# Patient Record
Sex: Female | Born: 1990 | Race: Black or African American | Hispanic: No | Marital: Single | Smoking: Current every day smoker
Health system: Southern US, Community
[De-identification: ages and names within clinical notes are randomized; demographics above are authoritative.]

## PROBLEM LIST (undated history)

## (undated) DIAGNOSIS — M549 Dorsalgia, unspecified: Secondary | ICD-10-CM

---

## 2009-03-27 ENCOUNTER — Emergency Department (HOSPITAL_BASED_OUTPATIENT_CLINIC_OR_DEPARTMENT_OTHER): Admission: EM | Admit: 2009-03-27 | Discharge: 2009-03-27 | Payer: Self-pay | Admitting: Emergency Medicine

## 2009-03-27 ENCOUNTER — Ambulatory Visit: Payer: Self-pay | Admitting: Diagnostic Radiology

## 2009-08-26 ENCOUNTER — Emergency Department (HOSPITAL_BASED_OUTPATIENT_CLINIC_OR_DEPARTMENT_OTHER): Admission: EM | Admit: 2009-08-26 | Discharge: 2009-08-26 | Payer: Self-pay | Admitting: Emergency Medicine

## 2009-11-30 ENCOUNTER — Emergency Department (HOSPITAL_BASED_OUTPATIENT_CLINIC_OR_DEPARTMENT_OTHER): Admission: EM | Admit: 2009-11-30 | Discharge: 2009-11-30 | Payer: Self-pay | Admitting: Emergency Medicine

## 2010-02-12 ENCOUNTER — Ambulatory Visit: Payer: Self-pay | Admitting: Diagnostic Radiology

## 2010-02-12 ENCOUNTER — Emergency Department (HOSPITAL_BASED_OUTPATIENT_CLINIC_OR_DEPARTMENT_OTHER): Admission: EM | Admit: 2010-02-12 | Discharge: 2010-02-12 | Payer: Self-pay | Admitting: Emergency Medicine

## 2010-06-09 ENCOUNTER — Emergency Department (HOSPITAL_BASED_OUTPATIENT_CLINIC_OR_DEPARTMENT_OTHER): Admission: EM | Admit: 2010-06-09 | Discharge: 2010-06-09 | Payer: Self-pay | Admitting: Emergency Medicine

## 2010-11-23 ENCOUNTER — Emergency Department (HOSPITAL_BASED_OUTPATIENT_CLINIC_OR_DEPARTMENT_OTHER)
Admission: EM | Admit: 2010-11-23 | Discharge: 2010-11-23 | Disposition: A | Discharge status: Home / Self Care | Payer: Medicaid Other | Attending: Emergency Medicine | Admitting: Emergency Medicine

## 2010-11-23 DIAGNOSIS — F17200 Nicotine dependence, unspecified, uncomplicated: Secondary | ICD-10-CM | POA: Insufficient documentation

## 2010-11-23 DIAGNOSIS — F319 Bipolar disorder, unspecified: Secondary | ICD-10-CM | POA: Insufficient documentation

## 2010-11-23 DIAGNOSIS — W57XXXA Bitten or stung by nonvenomous insect and other nonvenomous arthropods, initial encounter: Secondary | ICD-10-CM | POA: Insufficient documentation

## 2010-11-23 DIAGNOSIS — S90569A Insect bite (nonvenomous), unspecified ankle, initial encounter: Secondary | ICD-10-CM | POA: Insufficient documentation

## 2010-11-26 LAB — URINALYSIS, ROUTINE W REFLEX MICROSCOPIC
Glucose, UA: NEGATIVE mg/dL
Ketones, ur: 15 mg/dL — AB
Leukocytes, UA: NEGATIVE
Nitrite: NEGATIVE
Protein, ur: 30 mg/dL — AB
Specific Gravity, Urine: 1.043 — ABNORMAL HIGH (ref 1.005–1.030)
Urobilinogen, UA: 0.2 mg/dL (ref 0.0–1.0)

## 2010-11-26 LAB — URINE MICROSCOPIC-ADD ON

## 2010-11-26 LAB — PREGNANCY, URINE: Preg Test, Ur: NEGATIVE

## 2010-12-07 LAB — DIFFERENTIAL
Basophils Relative: 0 % (ref 0–1)
Eosinophils Absolute: 0 10*3/uL (ref 0.0–0.7)
Eosinophils Relative: 1 % (ref 0–5)
Lymphs Abs: 3 10*3/uL (ref 0.7–4.0)
Neutro Abs: 4.6 10*3/uL (ref 1.7–7.7)

## 2010-12-07 LAB — CBC
Hemoglobin: 12.2 g/dL (ref 12.0–15.0)
MCHC: 32.3 g/dL (ref 30.0–36.0)
MCV: 85.5 fL (ref 78.0–100.0)
WBC: 8.2 10*3/uL (ref 4.0–10.5)

## 2011-05-11 ENCOUNTER — Emergency Department (HOSPITAL_BASED_OUTPATIENT_CLINIC_OR_DEPARTMENT_OTHER)
Admission: EM | Admit: 2011-05-11 | Discharge: 2011-05-12 | Disposition: A | Discharge status: Home / Self Care | Payer: Self-pay | Attending: Emergency Medicine | Admitting: Emergency Medicine

## 2011-05-11 ENCOUNTER — Encounter: Payer: Self-pay | Admitting: *Deleted

## 2011-05-11 DIAGNOSIS — M549 Dorsalgia, unspecified: Secondary | ICD-10-CM

## 2011-05-11 DIAGNOSIS — Y92410 Unspecified street and highway as the place of occurrence of the external cause: Secondary | ICD-10-CM | POA: Insufficient documentation

## 2011-05-11 DIAGNOSIS — S060XAA Concussion with loss of consciousness status unknown, initial encounter: Secondary | ICD-10-CM | POA: Insufficient documentation

## 2011-05-11 DIAGNOSIS — R51 Headache: Secondary | ICD-10-CM | POA: Insufficient documentation

## 2011-05-11 DIAGNOSIS — M7918 Myalgia, other site: Secondary | ICD-10-CM

## 2011-05-11 DIAGNOSIS — R55 Syncope and collapse: Secondary | ICD-10-CM

## 2011-05-11 DIAGNOSIS — M25519 Pain in unspecified shoulder: Secondary | ICD-10-CM | POA: Insufficient documentation

## 2011-05-11 DIAGNOSIS — S060X9A Concussion with loss of consciousness of unspecified duration, initial encounter: Secondary | ICD-10-CM

## 2011-05-11 DIAGNOSIS — IMO0001 Reserved for inherently not codable concepts without codable children: Secondary | ICD-10-CM | POA: Insufficient documentation
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
## 2011-05-12 ENCOUNTER — Emergency Department (INDEPENDENT_AMBULATORY_CARE_PROVIDER_SITE_OTHER): Payer: Self-pay

## 2011-05-12 DIAGNOSIS — M549 Dorsalgia, unspecified: Secondary | ICD-10-CM

## 2011-05-12 DIAGNOSIS — R51 Headache: Secondary | ICD-10-CM

## 2011-05-12 LAB — CBC
HCT: 35.5 % — ABNORMAL LOW (ref 36.0–46.0)
MCHC: 33.5 g/dL (ref 30.0–36.0)
MCV: 84.7 fL (ref 78.0–100.0)
Platelets: 209 10*3/uL (ref 150–400)
RBC: 4.19 MIL/uL (ref 3.87–5.11)

## 2011-05-12 LAB — BASIC METABOLIC PANEL
BUN: 12 mg/dL (ref 6–23)
Chloride: 106 mEq/L (ref 96–112)
GFR calc Af Amer: >60 mL/min (ref >60)
Glucose, Bld: 84 mg/dL (ref 70–99)
Potassium: 4.1 mEq/L (ref 3.5–5.1)
Sodium: 140 mEq/L (ref 135–145)

## 2011-05-12 LAB — DIFFERENTIAL
Band Neutrophils: 0 % (ref 0–10)
Basophils Absolute: 0 10*3/uL (ref 0.0–0.1)
Lymphocytes Relative: 68 % — ABNORMAL HIGH (ref 12–46)
Promyelocytes Absolute: 0 %

## 2011-05-12 MED ORDER — IBUPROFEN 600 MG PO TABS: 600.0000 mg | ORAL_TABLET | Freq: Four times a day (QID) | ORAL | Status: AC | PRN

## 2011-05-12 MED ORDER — CYCLOBENZAPRINE HCL 10 MG PO TABS: 10.0000 mg | ORAL_TABLET | Freq: Two times a day (BID) | ORAL | Status: AC | PRN

## 2011-05-12 MED ORDER — ACETAMINOPHEN 500 MG PO TABS
1000.0000 mg | ORAL_TABLET | Freq: Once | ORAL | Status: AC
  Administered 2011-05-12: 1000 mg via ORAL
  Filled 2011-05-12: qty 2

## 2011-05-12 MED ORDER — HYDROCODONE-ACETAMINOPHEN 5-325 MG PO TABS: 2.0000 | ORAL_TABLET | ORAL | Status: AC | PRN
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
## 2011-06-28 ENCOUNTER — Emergency Department (INDEPENDENT_AMBULATORY_CARE_PROVIDER_SITE_OTHER): Payer: No Typology Code available for payment source

## 2011-06-28 ENCOUNTER — Encounter (HOSPITAL_BASED_OUTPATIENT_CLINIC_OR_DEPARTMENT_OTHER): Payer: Self-pay | Admitting: Family Medicine

## 2011-06-28 ENCOUNTER — Emergency Department (HOSPITAL_BASED_OUTPATIENT_CLINIC_OR_DEPARTMENT_OTHER)
Admission: EM | Admit: 2011-06-28 | Discharge: 2011-06-28 | Disposition: A | Discharge status: Home / Self Care | Payer: No Typology Code available for payment source | Attending: Emergency Medicine | Admitting: Emergency Medicine

## 2011-06-28 DIAGNOSIS — F17200 Nicotine dependence, unspecified, uncomplicated: Secondary | ICD-10-CM | POA: Insufficient documentation

## 2011-06-28 DIAGNOSIS — M549 Dorsalgia, unspecified: Secondary | ICD-10-CM

## 2011-06-28 DIAGNOSIS — Y92410 Unspecified street and highway as the place of occurrence of the external cause: Secondary | ICD-10-CM | POA: Insufficient documentation

## 2011-06-28 HISTORY — DX: Dorsalgia, unspecified: M54.9

## 2011-06-28 MED ORDER — IBUPROFEN 800 MG PO TABS
800.0000 mg | ORAL_TABLET | Freq: Once | ORAL | Status: AC
  Administered 2011-06-28: 800 mg via ORAL
  Filled 2011-06-28: qty 1

## 2011-06-28 MED ORDER — IBUPROFEN 600 MG PO TABS: 600.0000 mg | ORAL_TABLET | Freq: Four times a day (QID) | ORAL | Status: AC | PRN
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
## 2011-11-17 ENCOUNTER — Encounter (HOSPITAL_BASED_OUTPATIENT_CLINIC_OR_DEPARTMENT_OTHER): Payer: Self-pay | Admitting: *Deleted

## 2011-11-17 ENCOUNTER — Emergency Department (HOSPITAL_BASED_OUTPATIENT_CLINIC_OR_DEPARTMENT_OTHER)
Admission: EM | Admit: 2011-11-17 | Discharge: 2011-11-17 | Disposition: A | Discharge status: Home Health | Payer: Self-pay | Attending: Emergency Medicine | Admitting: Emergency Medicine

## 2011-11-17 DIAGNOSIS — R51 Headache: Secondary | ICD-10-CM | POA: Insufficient documentation

## 2011-11-17 DIAGNOSIS — K5289 Other specified noninfective gastroenteritis and colitis: Secondary | ICD-10-CM | POA: Insufficient documentation

## 2011-11-17 DIAGNOSIS — R1110 Vomiting, unspecified: Secondary | ICD-10-CM | POA: Insufficient documentation

## 2011-11-17 DIAGNOSIS — R197 Diarrhea, unspecified: Secondary | ICD-10-CM | POA: Insufficient documentation

## 2011-11-17 DIAGNOSIS — K529 Noninfective gastroenteritis and colitis, unspecified: Secondary | ICD-10-CM

## 2011-11-17 MED ORDER — ONDANSETRON HCL 4 MG PO TABS: 4.0000 mg | ORAL_TABLET | Freq: Four times a day (QID) | ORAL | Status: AC

## 2011-11-17 MED ORDER — ONDANSETRON 4 MG PO TBDP
4.0000 mg | ORAL_TABLET | Freq: Once | ORAL | Status: AC
  Administered 2011-11-17: 4 mg via ORAL
  Filled 2011-11-17: qty 1

## 2011-11-17 NOTE — ED Notes (Signed)
Chart reviewed and care assumed. 

## 2011-11-17 NOTE — ED Notes (Signed)
Pt c/o n/v/d x 1 day; 

## 2011-11-17 NOTE — Discharge Instructions (Signed)
Diet for Diarrhea, Adult Having frequent, runny stools (diarrhea) has many causes. Diarrhea may be caused or worsened by food or drink. Diarrhea may be relieved by changing your diet. IF YOU ARE NOT TOLERATING SOLID FOODS:  Drink enough water and fluids to keep your urine clear or pale yellow.   Avoid sugary drinks and sodas as well as milk-based beverages.   Avoid beverages containing caffeine and alcohol.   You may try rehydrating beverages. You can make your own by following this recipe:    tsp table salt.    tsp baking soda.   ? tsp salt substitute (potassium chloride).   1 tbs + 1 tsp sugar.   1 qt water.  As your stools become more solid, you can start eating solid foods. Add foods one at a time. If a certain food causes your diarrhea to get worse, avoid that food and try other foods. A low fiber, low-fat, and lactose-free diet is recommended. Small, frequent meals may be better tolerated.  Starches  Allowed:  White, French, and pita breads, plain rolls, buns, bagels. Plain muffins, matzo. Soda, saltine, or graham crackers. Pretzels, melba toast, zwieback. Cooked cereals made with water: cornmeal, farina, cream cereals. Dry cereals: refined corn, wheat, rice. Potatoes prepared any way without skins, refined macaroni, spaghetti, noodles, refined rice.   Avoid:  Bread, rolls, or crackers made with whole wheat, multi-grains, rye, bran seeds, nuts, or coconut. Corn tortillas or taco shells. Cereals containing whole grains, multi-grains, bran, coconut, nuts, or raisins. Cooked or dry oatmeal. Coarse wheat cereals, granola. Cereals advertised as "high-fiber." Potato skins. Whole grain pasta, wild or brown rice. Popcorn. Sweet potatoes/yams. Sweet rolls, doughnuts, waffles, pancakes, sweet breads.  Vegetables  Allowed: Strained tomato and vegetable juices. Most well-cooked and canned vegetables without seeds. Fresh: Tender lettuce, cucumber without the skin, cabbage, spinach, bean  sprouts.   Avoid: Fresh, cooked, or canned: Artichokes, baked beans, beet greens, broccoli, Brussels sprouts, corn, kale, legumes, peas, sweet potatoes. Cooked: Green or red cabbage, spinach. Avoid large servings of any vegetables, because vegetables shrink when cooked, and they contain more fiber per serving than fresh vegetables.  Fruit  Allowed: All fruit juices except prune juice. Cooked or canned: Apricots, applesauce, cantaloupe, cherries, fruit cocktail, grapefruit, grapes, kiwi, mandarin oranges, peaches, pears, plums, watermelon. Fresh: Apples without skin, ripe banana, grapes, cantaloupe, cherries, grapefruit, peaches, oranges, plums. Keep servings limited to  cup or 1 piece.   Avoid: Fresh: Apple with skin, apricots, mango, pears, raspberries, strawberries. Prune juice, stewed or dried prunes. Dried fruits, raisins, dates. Large servings of all fresh fruits.  Meat and Meat Substitutes  Allowed: Ground or well-cooked tender beef, ham, veal, lamb, pork, or poultry. Eggs, plain cheese. Fish, oysters, shrimp, lobster, other seafoods. Liver, organ meats.   Avoid: Tough, fibrous meats with gristle. Peanut butter, smooth or chunky. Cheese, nuts, seeds, legumes, dried peas, beans, lentils.  Milk  Allowed: Yogurt, lactose-free milk, kefir, drinkable yogurt, buttermilk, soy milk.   Avoid: Milk, chocolate milk, beverages made with milk, such as milk shakes.  Soups  Allowed: Bouillon, broth, or soups made from allowed foods. Any strained soup.   Avoid: Soups made from vegetables that are not allowed, cream or milk-based soups.  Desserts and Sweets  Allowed: Sugar-free gelatin, sugar-free frozen ice pops made without sugar alcohol.   Avoid: Plain cakes and cookies, pie made with allowed fruit, pudding, custard, cream pie. Gelatin, fruit, ice, sherbet, frozen ice pops. Ice cream, ice milk without nuts. Plain hard candy,   honey, jelly, molasses, syrup, sugar, chocolate syrup, gumdrops,  marshmallows.  Fats and Oils  Allowed: Avoid any fats and oils.   Avoid: Seeds, nuts, olives, avocados. Margarine, butter, cream, mayonnaise, salad oils, plain salad dressings made from allowed foods. Plain gravy, crisp bacon without rind.  Beverages  Allowed: Water, decaffeinated teas, oral rehydration solutions, sugar-free beverages.   Avoid: Fruit juices, caffeinated beverages (coffee, tea, soda or pop), alcohol, sports drinks, or lemon-lime soda or pop.  Condiments  Allowed: Ketchup, mustard, horseradish, vinegar, cream sauce, cheese sauce, cocoa powder. Spices in moderation: allspice, basil, bay leaves, celery powder or leaves, cinnamon, cumin powder, curry powder, ginger, mace, marjoram, onion or garlic powder, oregano, paprika, parsley flakes, ground pepper, rosemary, sage, savory, tarragon, thyme, turmeric.   Avoid: Coconut, honey.  Weight Monitoring: Weigh yourself every day. You should weigh yourself in the morning after you urinate and before you eat breakfast. Wear the same amount of clothing when you weigh yourself. Record your weight daily. Bring your recorded weights to your clinic visits. Tell your caregiver right away if you have gained 3 lb/1.4 kg or more in 1 day, 5 lb/2.3 kg in a week, or whatever amount you were told to report. SEEK IMMEDIATE MEDICAL CARE IF:   You are unable to keep fluids down.   You start to throw up (vomit) or diarrhea keeps coming back (persistent).   Abdominal pain develops, increases, or can be felt in one place (localizes).   You have an oral temperature above 102 F (38.9 C), not controlled by medicine.   Diarrhea contains blood or mucus.   You develop excessive weakness, dizziness, fainting, or extreme thirst.  MAKE SURE YOU:   Understand these instructions.   Will watch your condition.   Will get help right away if you are not doing well or get worse.  Document Released: 11/20/2003 Document Revised: 08/19/2011 Document Reviewed:  03/13/2009 [employer] Patient Information 2012 [employer]. 

## 2011-11-17 NOTE — ED Provider Notes (Addendum)
History     CSN: [id number]  Arrival date & time 11/17/11  1351   First MD Initiated Contact with Patient 11/17/11 1419      Chief Complaint  Patient presents with  . Emesis  . Diarrhea  . Headache    (Consider location/radiation/quality/duration/timing/severity/associated sxs/prior treatment) Patient is a 21 y.o. female presenting with vomiting, diarrhea, and headaches. The history is provided by the patient.  Emesis  This is a new problem. The current episode started 6 to 12 hours ago. Episode frequency: 1 time. The problem has not changed since onset.The emesis has an appearance of stomach contents. There has been no fever. Associated symptoms include diarrhea and headaches. Pertinent negatives include no abdominal pain, no cough, no fever and no URI. Risk factors include ill contacts.  Diarrhea The primary symptoms include vomiting and diarrhea. Primary symptoms do not include fever or abdominal pain. The illness began today (1 episode). The onset was sudden. The problem has not changed since onset. Associated medical issues do not include inflammatory bowel disease, GERD, gallstones or liver disease.  Headache  Associated symptoms include vomiting. Pertinent negatives include no fever.    Past Medical History  Diagnosis Date  . Back pain     History reviewed. No pertinent past surgical history.  History reviewed. No pertinent family history.  History  Substance Use Topics  . Smoking status: Current Everyday Smoker -- 0.5 packs/day    Types: Cigarettes  . Smokeless tobacco: Not on file  . Alcohol Use: Yes    OB History    Grav Para Term Preterm Abortions TAB SAB Ect Mult Living                  Review of Systems  Constitutional: Negative for fever.  Respiratory: Negative for cough.   Gastrointestinal: Positive for vomiting and diarrhea. Negative for abdominal pain.  Neurological: Positive for headaches.  All other systems reviewed and are  negative.    Allergies  Review of patient's allergies indicates no known allergies.  Home Medications   Current Outpatient Rx  Name Route Sig Dispense Refill  . ESTRADIOL CYPIONATE 5 MG/ML IM OIL Intramuscular Inject 2 mg into the muscle every 28 (twenty-eight) days.        BP 116/69  Pulse 85  Temp 98 F (36.7 C)  Resp 16  Ht 5\' 3"  (1.6 m)  Wt 136 lb (61.689 kg)  BMI 24.09 kg/m2  SpO2 100%  Physical Exam  Nursing note and vitals reviewed. Constitutional: She is oriented to person, place, and time. She appears well-developed and well-nourished. No distress.  HENT:  Head: Normocephalic and atraumatic.  Mouth/Throat: Mucous membranes are dry.  Eyes: EOM are normal. Pupils are equal, round, and reactive to light.  Cardiovascular: Normal rate, regular rhythm, normal heart sounds and intact distal pulses.  Exam reveals no friction rub.   No murmur heard. Pulmonary/Chest: Effort normal and breath sounds normal. She has no wheezes. She has no rales.  Abdominal: Soft. Bowel sounds are normal. She exhibits no distension. There is no tenderness. There is no rebound and no guarding.  Musculoskeletal: Normal range of motion. She exhibits no tenderness.       No edema  Neurological: She is alert and oriented to person, place, and time. No cranial nerve deficit.  Skin: Skin is warm and dry. No rash noted.  Psychiatric: She has a normal mood and affect. Her behavior is normal.    ED Course  Procedures (including critical care time)  Labs Reviewed - No data to display No results found.   1. Gastroenteritis       MDM   Pt with symptoms most consistent with a viral process with vomitting/diarrhea.  Denies bad food exposure and recent travel out of the country. REcent sick contacts with same sx. No recent abx.  No hx concerning for GU pathology or kidney stones.  Pt is awake and alert on exam without peritoneal signs.  No abd pain. Pt given ODT zofran.  Will Po challenge  2:55  PM Feeling better will d/c home.         Gwyneth Sprout, MD 11/17/11 1433  Gwyneth Sprout, MD 11/17/11 1455

## 2012-02-02 ENCOUNTER — Encounter (HOSPITAL_BASED_OUTPATIENT_CLINIC_OR_DEPARTMENT_OTHER): Payer: Self-pay

## 2012-02-02 ENCOUNTER — Emergency Department (HOSPITAL_BASED_OUTPATIENT_CLINIC_OR_DEPARTMENT_OTHER)
Admission: EM | Admit: 2012-02-02 | Discharge: 2012-02-02 | Disposition: A | Discharge status: Home / Self Care | Payer: Self-pay | Attending: Emergency Medicine | Admitting: Emergency Medicine

## 2012-02-02 DIAGNOSIS — R112 Nausea with vomiting, unspecified: Secondary | ICD-10-CM | POA: Insufficient documentation

## 2012-02-02 LAB — URINALYSIS, ROUTINE W REFLEX MICROSCOPIC
Hgb urine dipstick: NEGATIVE
Nitrite: NEGATIVE
Protein, ur: NEGATIVE mg/dL
Specific Gravity, Urine: 1.02 (ref 1.005–1.030)
pH: 7.5 (ref 5.0–8.0)

## 2012-02-02 LAB — PREGNANCY, URINE: Preg Test, Ur: NEGATIVE

## 2012-02-02 NOTE — Discharge Instructions (Signed)
Return to the ED with any concerns including vomiting and not able to keep down liquids or your medications, abdominal pain especially if it localizes to the right lower abdomen, fever or chills, and decreased urine output, decreased level of alertness or lethargy, or any other alarming symptoms.  °

## 2012-02-02 NOTE — ED Notes (Signed)
C/o abd pain, vomiting last night-no vomiting today since taking zofran-states "i just need a note for work"

## 2012-02-02 NOTE — ED Provider Notes (Signed)
History     CSN: [id number]  Arrival date & time 02/02/12  1535   First MD Initiated Contact with Patient 02/02/12 1548      Chief Complaint  Patient presents with  . Abdominal Pain  . Emesis    (Consider location/radiation/quality/duration/timing/severity/associated sxs/prior treatment) HPI Pt presents with c/o nausea and vomiting which began earlier today.  She denies any abdominal pain.  No fever/chills, no diarrhea.  She took some zofran which she had from a prior illness and this has improved her nausea.  She has been able to keep down liquids without difficulty.  Has not tried to eat solid food.  States she has been around others with similar illness at her work, she also states her job requested her to come to the ED and to bring a work note.  There are no other associated systemic symptoms.  She takes depo for birth control, LMP unknown.  There are no alleviating or modifying factors.   Past Medical History  Diagnosis Date  . Back pain     History reviewed. No pertinent past surgical history.  No family history on file.  History  Substance Use Topics  . Smoking status: Current Everyday Smoker -- 0.5 packs/day    Types: Cigarettes  . Smokeless tobacco: Not on file  . Alcohol Use: Yes    OB History    Grav Para Term Preterm Abortions TAB SAB Ect Mult Living                  Review of Systems ROS reviewed and all otherwise negative except for mentioned in HPI  Allergies  Review of patient's allergies indicates no known allergies.  Home Medications   Current Outpatient Rx  Name Route Sig Dispense Refill  . ESTRADIOL CYPIONATE 5 MG/ML IM OIL Intramuscular Inject 2 mg into the muscle every 28 (twenty-eight) days.        BP 112/70  Pulse 75  Temp(Src) 98.6 F (37 C) (Oral)  Resp 16  Ht 5\' 3"  (1.6 m)  Wt 132 lb (59.875 kg)  BMI 23.38 kg/m2  SpO2 100% Vitals reviewed Physical Exam Physical Examination: General appearance - alert, well appearing, and in  no distress Mental status - alert, oriented to person, place, and time Eyes - pupils equal and reactive, no scleral icterus Mouth - mucous membranes moist, pharynx normal without lesions Chest - clear to auscultation, no wheezes, rales or rhonchi, symmetric air entry Heart - normal rate, regular rhythm, normal S1, S2, no murmurs, rubs, clicks or gallops Abdomen - soft, nontender, nabs, nondistended, no masses or organomegaly Extremities - peripheral pulses normal, no pedal edema, no clubbing or cyanosis Skin - normal coloration and turgor, no rashes, brisk cap refill  ED Course  Procedures (including critical care time)   Labs Reviewed  URINALYSIS, ROUTINE W REFLEX MICROSCOPIC  PREGNANCY, URINE   No results found.   1. Nausea and vomiting       MDM  Pt presenting with vomiting which has been controlled at home with zofran that she had from a prior prescription.  She is nontoxic and well hdyrated in appearance, benign abdominal exam.  Urinalysis reassuring and urine preg negative.  Pt discharged with strict return precautions.  She is agreeable with this plan.         Ethelda Chick, MD 02/02/12 [phone]

## 2012-02-02 NOTE — ED Notes (Signed)
Pt reports nausea and vomiting, took Zofran and feels better.  States she's here for a work note.

## 2012-08-02 ENCOUNTER — Ambulatory Visit: Payer: Self-pay

## 2013-03-28 ENCOUNTER — Emergency Department (HOSPITAL_BASED_OUTPATIENT_CLINIC_OR_DEPARTMENT_OTHER)
Admission: EM | Admit: 2013-03-28 | Discharge: 2013-03-29 | Disposition: A | Discharge status: Home / Self Care | Payer: Self-pay | Attending: Emergency Medicine | Admitting: Emergency Medicine

## 2013-03-28 ENCOUNTER — Emergency Department (HOSPITAL_BASED_OUTPATIENT_CLINIC_OR_DEPARTMENT_OTHER): Payer: Self-pay

## 2013-03-28 ENCOUNTER — Encounter (HOSPITAL_BASED_OUTPATIENT_CLINIC_OR_DEPARTMENT_OTHER): Payer: Self-pay

## 2013-03-28 DIAGNOSIS — S93601A Unspecified sprain of right foot, initial encounter: Secondary | ICD-10-CM

## 2013-03-28 DIAGNOSIS — S93609A Unspecified sprain of unspecified foot, initial encounter: Secondary | ICD-10-CM | POA: Insufficient documentation

## 2013-03-28 DIAGNOSIS — IMO0002 Reserved for concepts with insufficient information to code with codable children: Secondary | ICD-10-CM | POA: Insufficient documentation

## 2013-03-28 DIAGNOSIS — F17200 Nicotine dependence, unspecified, uncomplicated: Secondary | ICD-10-CM | POA: Insufficient documentation

## 2013-03-28 DIAGNOSIS — Y9389 Activity, other specified: Secondary | ICD-10-CM | POA: Insufficient documentation

## 2013-03-28 DIAGNOSIS — Y929 Unspecified place or not applicable: Secondary | ICD-10-CM | POA: Insufficient documentation
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
## 2013-03-29 MED ORDER — NAPROXEN 250 MG PO TABS
500.0000 mg | ORAL_TABLET | Freq: Once | ORAL | Status: AC
  Administered 2013-03-29: 500 mg via ORAL
  Filled 2013-03-29 (×2): qty 2

## 2013-03-29 MED ORDER — HYDROCODONE-ACETAMINOPHEN 5-325 MG PO TABS: 1.0000 | ORAL_TABLET | ORAL | Status: DC | PRN

## 2013-03-29 NOTE — ED Provider Notes (Signed)
   History    CSN: [id number] Arrival date & time April 13, 2013  2247  First MD Initiated Contact with Patient 03/29/13 0045     Chief Complaint  Patient presents with  . Foot Injury   (Consider location/radiation/quality/duration/timing/severity/associated sxs/prior Treatment) HPI This is a 22 year old female who struck the dorsum of her right foot on a dresser about 7 PM yesterday evening. She was playing at the time. She is not sure exactly how she did it. She is now complaining of moderate to severe pain in the lateral aspect of the right foot as well as the sole of the right foot. There is no associated deformity but there is ecchymosis. She states she is unable to bear weight. She denies other injury.  Past Medical History  Diagnosis Date  . Back pain    History reviewed. No pertinent past surgical history. No family history on file. History  Substance Use Topics  . Smoking status: Current Every Day Smoker -- 0.50 packs/day    Types: Cigarettes  . Smokeless tobacco: Not on file  . Alcohol Use: Yes   OB History   Grav Para Term Preterm Abortions TAB SAB Ect Mult Living                 Review of Systems  All other systems reviewed and are negative.    Allergies  Review of patient's allergies indicates no known allergies.  Home Medications   Current Outpatient Rx  Name  Route  Sig  Dispense  Refill  . estradiol cypionate (DEPO-ESTRADIOL) 5 MG/ML injection   Intramuscular   Inject 2 mg into the muscle every 28 (twenty-eight) days.            BP 109/59  Pulse 86  Temp(Src) 98.1 F (36.7 C) (Oral)  Resp 16  Ht 5\' 3"  (1.6 m)  Wt 127 lb (57.607 kg)  BMI 22.5 kg/m2  SpO2 100%  Physical Exam General: Well-developed, well-nourished female in no acute distress; appearance consistent with age of record HENT: normocephalic, atraumatic Eyes: pupils equal round and reactive to light; extraocular muscles intact Neck: supple Heart: regular rate and rhythm Lungs:  clear to auscultation bilaterally Abdomen: soft; nondistended; nontender; no masses or hepatosplenomegaly; bowel sounds present Extremities: No deformity; full range of motion; pulses normal; tenderness and ecchymosis, but no significant swelling, over the lateral right foot with pain on movement of the foot Neurologic: Awake, alert and oriented; motor function intact in all extremities and symmetric; no facial droop Skin: Warm and dry Psychiatric: Normal mood and affect    ED Course  Procedures (including critical care time)   MDM  Nursing notes and vitals signs, including pulse oximetry, reviewed.  Summary of this visit's results, reviewed by myself:   Imaging Studies: Dg Foot Complete Right  2013-04-13   *RADIOLOGY REPORT*  Clinical Data: Foot injury.  RIGHT FOOT COMPLETE - 3+ VIEW  Comparison: None.  Findings: Three views of the right foot were obtained.  There is normal alignment.  Soft tissues are within normal limits.  IMPRESSION: No acute bony abnormality to the right foot.   Original Report Authenticated By: Richarda Overlie, M.D.      Hanley Seamen, MD 03/29/13 [phone]

## 2013-03-29 NOTE — ED Notes (Signed)
Two pillows provided to elevate right foot.  Ice applied.  Waiting for edp eval.

## 2013-03-29 NOTE — ED Notes (Signed)
MD at bedside. 

## 2013-04-20 ENCOUNTER — Emergency Department (HOSPITAL_BASED_OUTPATIENT_CLINIC_OR_DEPARTMENT_OTHER)
Admission: EM | Admit: 2013-04-20 | Discharge: 2013-04-20 | Disposition: A | Discharge status: Home / Self Care | Payer: Self-pay | Attending: Emergency Medicine | Admitting: Emergency Medicine

## 2013-04-20 ENCOUNTER — Encounter (HOSPITAL_BASED_OUTPATIENT_CLINIC_OR_DEPARTMENT_OTHER): Payer: Self-pay | Admitting: Emergency Medicine

## 2013-04-20 DIAGNOSIS — H109 Unspecified conjunctivitis: Secondary | ICD-10-CM | POA: Insufficient documentation

## 2013-04-20 DIAGNOSIS — F17200 Nicotine dependence, unspecified, uncomplicated: Secondary | ICD-10-CM | POA: Insufficient documentation

## 2013-04-20 DIAGNOSIS — Z23 Encounter for immunization: Secondary | ICD-10-CM | POA: Insufficient documentation

## 2013-04-20 MED ORDER — FLUORESCEIN SODIUM 1 MG OP STRP
1.0000 | ORAL_STRIP | Freq: Once | OPHTHALMIC | Status: AC
  Administered 2013-04-20: 1 via OPHTHALMIC
  Filled 2013-04-20: qty 1

## 2013-04-20 MED ORDER — ERYTHROMYCIN 5 MG/GM OP OINT: TOPICAL_OINTMENT | OPHTHALMIC | Status: DC

## 2013-04-20 MED ORDER — TETANUS-DIPHTH-ACELL PERTUSSIS 5-2.5-18.5 LF-MCG/0.5 IM SUSP
0.5000 mL | Freq: Once | INTRAMUSCULAR | Status: AC
  Administered 2013-04-20: 0.5 mL via INTRAMUSCULAR
  Filled 2013-04-20: qty 0.5

## 2013-04-20 MED ORDER — TETRACAINE HCL 0.5 % OP SOLN
1.0000 [drp] | Freq: Once | OPHTHALMIC | Status: AC
  Administered 2013-04-20: 1 [drp] via OPHTHALMIC
  Filled 2013-04-20: qty 2

## 2013-04-20 NOTE — ED Provider Notes (Signed)
CSN: [id number]     Arrival date & time 04/20/13  1624 History     First MD Initiated Contact with Patient 04/20/13 1743     Chief Complaint  Patient presents with  . Eye Pain   (Consider location/radiation/quality/duration/timing/severity/associated sxs/prior Treatment) The history is provided by the patient. No language interpreter was used.  Alicia Odonnell is a 22 y/o F with PMHx of back pain presenting to the ED with left eye irritation that started 2 days ago. Patient reported that he feels like something is in her eye, she reported that she looked in the mirror today and saw eyelashes in her eye. Patient reported that there is no radiation. Stated that she has been using hot washcloths for pain relief, denied any other remedies for pain control. Patient reported that when she woke up this morning she had crusts in her I, stated that she had to pull her I apartment this morning. Reported that she has been tearing clear tears, has been rubbing her eye continuously throughout the day, reported that she noticed mild swelling to the left eye. Patient stated that one of her friends was recently diagnosed with conjunctivitis, stated that she was hanging out with her when she was diagnosed with the eye infection, but stated that the patient was orientating antibiotic treatment. Denied for the loss of vision, eye pressure, eye pain, headache, dizziness, nausea, vomiting, fever, chills, trauma, injury, visual distortions. PCP none   Past Medical History  Diagnosis Date  . Back pain    History reviewed. No pertinent past surgical history. No family history on file. History  Substance Use Topics  . Smoking status: Current Every Day Smoker -- 0.50 packs/day    Types: Cigarettes  . Smokeless tobacco: Not on file  . Alcohol Use: Yes     Comment: occ   OB History   Grav Para Term Preterm Abortions TAB SAB Ect Mult Living                 Review of Systems  Constitutional: Negative for fever  and chills.  HENT: Negative for neck pain and neck stiffness.   Eyes: Positive for discharge, redness and itching. Negative for visual disturbance.  Respiratory: Negative for cough, chest tightness and shortness of breath.   Cardiovascular: Negative for chest pain.  Neurological: Negative for dizziness, weakness, light-headedness and headaches.  All other systems reviewed and are negative.    Allergies  Review of patient's allergies indicates no known allergies.  Home Medications   Current Outpatient Rx  Name  Route  Sig  Dispense  Refill  . erythromycin ophthalmic ointment      Place a 1/2 inch ribbon of ointment into the lower eyelid of left eye QID x 7 days   3.5 g   0   . estradiol cypionate (DEPO-ESTRADIOL) 5 MG/ML injection   Intramuscular   Inject 2 mg into the muscle every 28 (twenty-eight) days.           Marland Kitchen HYDROcodone-acetaminophen (NORCO/VICODIN) 5-325 MG per tablet   Oral   Take 1 tablet by mouth every 4 (four) hours as needed for pain.   20 tablet   0    BP 122/77  Pulse 84  Temp(Src) 98.5 F (36.9 C) (Oral)  Resp 16  Ht 5\' 4"  (1.626 m)  Wt 125 lb (56.7 kg)  BMI 21.45 kg/m2  SpO2 100% Physical Exam  Nursing note and vitals reviewed. Constitutional: She is oriented to person, place, and time. She  appears well-developed and well-nourished. No distress.  HENT:  Head: Normocephalic and atraumatic.  Mouth/Throat: Oropharynx is clear and moist. No oropharyngeal exudate.  Negative pain upon palpation to face  Eyes: EOM are normal. Pupils are equal, round, and reactive to light. Left eye exhibits discharge. Left eye exhibits no chemosis, no exudate and no hordeolum. No foreign body present in the left eye. Left conjunctiva is injected. Left conjunctiva has no hemorrhage. Left eye exhibits normal extraocular motion and no nystagmus. Left pupil is round and reactive.  Slit lamp exam:      The left eye shows no corneal abrasion, no corneal flare, no corneal  ulcer, no foreign body, no hyphema, no hypopyon, no fluorescein uptake and no anterior chamber bulge.    Mild swelling noted to the left eye - localized to the lower and upper lids.  Mild thick yellowish, discharge noted to the medical canthus Patient continuously rubbing eye - pruritus Injection to sclera of the left eye  Negative fluorescein uptake. Negative Seidel's sign. Negative dendritic lesion.   Neck: Normal range of motion. Neck supple.  Cardiovascular: Normal rate, regular rhythm and normal heart sounds.  Exam reveals no friction rub.   No murmur heard. Pulmonary/Chest: Breath sounds normal. No respiratory distress. She has no wheezes. She has no rales.  Lymphadenopathy:    She has no cervical adenopathy.  Neurological: She is alert and oriented to person, place, and time. She exhibits normal muscle tone. Coordination normal.  Skin: Skin is warm and dry. No rash noted. She is not diaphoretic. No erythema.  Psychiatric: She has a normal mood and affect. Her behavior is normal. Thought content normal.    ED Course   Procedures (including critical care time)  Medications  tetracaine (PONTOCAINE) 0.5 % ophthalmic solution 1 drop (1 drop Left Eye Given 04/20/13 1830)  fluorescein ophthalmic strip 1 strip (1 strip Left Eye Given 04/20/13 1831)  TDaP (BOOSTRIX) injection 0.5 mL (0.5 mLs Intramuscular Given 04/20/13 2011)    Labs Reviewed - No data to display No results found. 1. Conjunctivitis     MDM  Patient presenting to the ED with left eye pain, with tearing, discharge and pruritis. Sclera injection noted to the left eye. Positive clear tears noted. Mild discharge, yellowish in nature noted to the left medial canthus. PERRLA. EOMs intact. Negative findings on the slit lamp. Negative Seidel's. Negative corneal abrasions. Negative dendritic lesions. Doubt herpetic keratitis. Doubt pre- and post-septal cellulitis. Suspicion to be viral vs. bacterial conjunctivitis. Patient  stable, afebrile. Discharged patient with antibiotics. Discussed with patient to rest and stay hydrated. Discussed with patient to keep all items that come in contact with her eye to be clean. Referred to ophthalmology. Discussed with patient to continue to monitor symptoms and if symptoms are to worsen or change to report back to the ED - strict return instructions given. Patient agreed to plan of care, understood, all questions answered,   Raymon Mutton, PA-C 04/21/13 [phone]

## 2013-04-20 NOTE — ED Notes (Addendum)
Pt reports two days ago it "felt like there was something in my eye" (left eye). Pt had "people blow in it" to try to remove what was in her eye.  Pt states she woke up this morning with her left eye red, swollen, "had puss in it," and light sensitivity.  Pt states on the way here her vision got blurry.  Pt in NAD, AAOx4.

## 2013-04-20 NOTE — ED Notes (Signed)
Left eye irritation, redness, and drainage x2 days.

## 2013-04-22 ENCOUNTER — Emergency Department (HOSPITAL_BASED_OUTPATIENT_CLINIC_OR_DEPARTMENT_OTHER)
Admission: EM | Admit: 2013-04-22 | Discharge: 2013-04-23 | Disposition: A | Discharge status: Home / Self Care | Payer: Self-pay | Attending: Emergency Medicine | Admitting: Emergency Medicine

## 2013-04-22 ENCOUNTER — Encounter (HOSPITAL_BASED_OUTPATIENT_CLINIC_OR_DEPARTMENT_OTHER): Payer: Self-pay | Admitting: Emergency Medicine

## 2013-04-22 DIAGNOSIS — H169 Unspecified keratitis: Secondary | ICD-10-CM | POA: Insufficient documentation

## 2013-04-22 DIAGNOSIS — H5789 Other specified disorders of eye and adnexa: Secondary | ICD-10-CM | POA: Insufficient documentation

## 2013-04-22 DIAGNOSIS — H538 Other visual disturbances: Secondary | ICD-10-CM | POA: Insufficient documentation

## 2013-04-22 DIAGNOSIS — F17200 Nicotine dependence, unspecified, uncomplicated: Secondary | ICD-10-CM | POA: Insufficient documentation

## 2013-04-22 DIAGNOSIS — B9789 Other viral agents as the cause of diseases classified elsewhere: Secondary | ICD-10-CM | POA: Insufficient documentation

## 2013-04-22 DIAGNOSIS — H5710 Ocular pain, unspecified eye: Secondary | ICD-10-CM | POA: Insufficient documentation

## 2013-04-22 DIAGNOSIS — H168 Other keratitis: Secondary | ICD-10-CM

## 2013-04-22 MED ORDER — FLUORESCEIN SODIUM 1 MG OP STRP
1.0000 | ORAL_STRIP | Freq: Once | OPHTHALMIC | Status: AC
  Administered 2013-04-23: 1 via OPHTHALMIC
  Filled 2013-04-22: qty 1

## 2013-04-22 MED ORDER — PROPARACAINE HCL 0.5 % OP SOLN
2.0000 [drp] | Freq: Once | OPHTHALMIC | Status: AC
  Administered 2013-04-23: 2 [drp] via OPHTHALMIC
  Filled 2013-04-22: qty 15

## 2013-04-22 NOTE — ED Provider Notes (Signed)
CSN: [id number]     Arrival date & time 04/22/13  2302 History    This chart was scribed for Jones Skene, MD by Quintella Reichert, ED scribe.  This patient was seen in room MH12/MH12 and the patient's care was started at 11:51 PM.    Chief Complaint  Patient presents with  . Eye Problem    The history is provided by the patient. No language interpreter was used.    HPI Comments: Alicia Odonnell is a 22 y.o. female who presents to the Emergency Department complaining of 4 days of moderate-to-severe left eye pain with associated redness, discharge and blurred vision.  Pt states that her pain is exacerbated to a severity of 8/10 on movement of the eye to the left or right.  Discharge is worse in the morning and she has been waking up with her eyes "glued shut."  She also reports that when she woke up today she had "red lumps on my eyelid."  Pt was seen in the ED 2 days ago and diagnosed with conjunctivitis and given erythromycin ointment, which she has been applying 4x/day without relief.  She denies fevers, chills, abdominal pain, vomiting, rash, or pain or swelling in legs.    Past Medical History  Diagnosis Date  . Back pain     History reviewed. No pertinent past surgical history.   No family history on file.   History  Substance Use Topics  . Smoking status: Current Every Day Smoker -- 0.50 packs/day    Types: Cigarettes  . Smokeless tobacco: Not on file  . Alcohol Use: Yes     Comment: occ    OB History   Grav Para Term Preterm Abortions TAB SAB Ect Mult Living                  Review of Systems At least 10pt or greater review of systems completed and are negative except where specified in the HPI.   Allergies  Review of patient's allergies indicates no known allergies.  Home Medications   Current Outpatient Rx  Name  Route  Sig  Dispense  Refill  . erythromycin ophthalmic ointment      Place a 1/2 inch ribbon of ointment into the lower eyelid of left  eye QID x 7 days   3.5 g   0   . estradiol cypionate (DEPO-ESTRADIOL) 5 MG/ML injection   Intramuscular   Inject 2 mg into the muscle every 28 (twenty-eight) days.           Marland Kitchen HYDROcodone-acetaminophen (NORCO/VICODIN) 5-325 MG per tablet   Oral   Take 1 tablet by mouth every 4 (four) hours as needed for pain.   20 tablet   0    BP 127/83  Pulse 90  Temp(Src) 98.4 F (36.9 C) (Oral)  Resp 16  Ht 5\' 4"  (1.626 m)  Wt 125 lb (56.7 kg)  BMI 21.45 kg/m2  SpO2 100%  Physical Exam  Nursing notes reviewed.  Electronic medical record reviewed. VITAL SIGNS:   Filed Vitals:   04/22/13 2309  BP: 127/83  Pulse: 90  Temp: 98.4 F (36.9 C)  TempSrc: Oral  Resp: 16  Height: 5\' 4"  (1.626 m)  Weight: 125 lb (56.7 kg)  SpO2: 100%   CONSTITUTIONAL: Awake, oriented, appears non-toxic HENT: Atraumatic, normocephalic, oral mucosa pink and moist, airway patent. Nares patent without drainage. External ears normal. EYES: Conjunctiva on right, left adjunctive is injected with no peri-limbic flush, EOMI, PERRLA. Anterior chambers are  clear, no hypopyon no hyphema. No photophobia. Proparacaine relieves eye pain completely. Punctate fluorescein uptake scattered across her cornea NECK: Trachea midline, non-tender, supple CARDIOVASCULAR: Normal heart rate, Normal rhythm, No murmurs, rubs, gallops PULMONARY/CHEST: Clear to auscultation, no rhonchi, wheezes, or rales. Symmetrical breath sounds. Non-tender. ABDOMINAL: Non-distended, soft, non-tender - no rebound or guarding.  BS normal. NEUROLOGIC: Non-focal, moving all four extremities, no gross sensory or motor deficits. EXTREMITIES: No clubbing, cyanosis, or edema SKIN: Warm, Dry, No erythema, No rash  ED Course  Procedures (including critical care time)  DIAGNOSTIC STUDIES: Oxygen Saturation is 100% on room air, normal by my interpretation.      Labs Reviewed - No data to display No results found. 1. Viral keratitis    Medications   ciprofloxacin (CILOXAN) 0.3 % ophthalmic solution 2 drop (2 drops Left Eye Given 04/23/13 0200)  proparacaine (ALCAINE) 0.5 % ophthalmic solution 2 drop (2 drops Left Eye Given 04/23/13 0010)  fluorescein ophthalmic strip 1 strip (1 strip Left Eye Given 04/23/13 0011)     MDM  Patient with likely viral keratitis. Patient is rubbing her eye frequently though, will switch her antibiotics to ciprofloxacin, also encourage lubricating eyedrops.  Likely self-limited infection, no dendritic forms on the eye, no fever, chills, no lymphadenopathy, patient's symptoms resolved on additional proparacaine suggestive of the superficial conjunctivitis.  I personally performed the services described in this documentation, which was scribed in my presence. The recorded information has been reviewed and is accurate. Jones Skene, M.D.      Jones Skene, MD 04/23/13 [phone]

## 2013-04-22 NOTE — ED Notes (Signed)
Left eye red.  Seen here 8/6 for same c/o.  Given ointment and states she has been using it but eye is no better.

## 2013-04-23 MED ORDER — FLUORESCEIN SODIUM 1 MG OP STRP
ORAL_STRIP | OPHTHALMIC | Status: AC
  Filled 2013-04-23: qty 1

## 2013-04-23 MED ORDER — CIPROFLOXACIN HCL 0.3 % OP SOLN: 2.0000 [drp] | OPHTHALMIC | Status: DC

## 2013-04-23 MED ORDER — CIPROFLOXACIN HCL 0.3 % OP SOLN
OPHTHALMIC | Status: AC
  Administered 2013-04-23: 2 [drp] via OPHTHALMIC
  Filled 2013-04-23: qty 2.5

## 2013-04-23 NOTE — ED Notes (Signed)
MD back at bedside to complete eye exam

## 2013-04-25 NOTE — ED Provider Notes (Signed)
Medical screening examination/treatment/procedure(s) were performed by non-physician practitioner and as supervising physician I was immediately available for consultation/collaboration.  Derwood Kaplan, MD 04/25/13 1215

## 2013-08-11 ENCOUNTER — Emergency Department (HOSPITAL_BASED_OUTPATIENT_CLINIC_OR_DEPARTMENT_OTHER)
Admission: EM | Admit: 2013-08-11 | Discharge: 2013-08-11 | Disposition: A | Discharge status: Home / Self Care | Payer: Self-pay | Attending: Emergency Medicine | Admitting: Emergency Medicine

## 2013-08-11 ENCOUNTER — Emergency Department (HOSPITAL_BASED_OUTPATIENT_CLINIC_OR_DEPARTMENT_OTHER): Payer: Self-pay

## 2013-08-11 ENCOUNTER — Encounter (HOSPITAL_BASED_OUTPATIENT_CLINIC_OR_DEPARTMENT_OTHER): Payer: Self-pay | Admitting: Emergency Medicine

## 2013-08-11 DIAGNOSIS — M79642 Pain in left hand: Secondary | ICD-10-CM

## 2013-08-11 DIAGNOSIS — Y929 Unspecified place or not applicable: Secondary | ICD-10-CM | POA: Insufficient documentation

## 2013-08-11 DIAGNOSIS — M25532 Pain in left wrist: Secondary | ICD-10-CM

## 2013-08-11 DIAGNOSIS — S6990XA Unspecified injury of unspecified wrist, hand and finger(s), initial encounter: Secondary | ICD-10-CM | POA: Insufficient documentation

## 2013-08-11 DIAGNOSIS — Y939 Activity, unspecified: Secondary | ICD-10-CM | POA: Insufficient documentation

## 2013-08-11 DIAGNOSIS — S59909A Unspecified injury of unspecified elbow, initial encounter: Secondary | ICD-10-CM | POA: Insufficient documentation

## 2013-08-11 DIAGNOSIS — Z8739 Personal history of other diseases of the musculoskeletal system and connective tissue: Secondary | ICD-10-CM | POA: Insufficient documentation

## 2013-08-11 DIAGNOSIS — Z79899 Other long term (current) drug therapy: Secondary | ICD-10-CM | POA: Insufficient documentation

## 2013-08-11 DIAGNOSIS — S79919A Unspecified injury of unspecified hip, initial encounter: Secondary | ICD-10-CM | POA: Insufficient documentation

## 2013-08-11 DIAGNOSIS — F17200 Nicotine dependence, unspecified, uncomplicated: Secondary | ICD-10-CM | POA: Insufficient documentation

## 2013-08-11 DIAGNOSIS — R296 Repeated falls: Secondary | ICD-10-CM | POA: Insufficient documentation

## 2013-08-11 DIAGNOSIS — Z792 Long term (current) use of antibiotics: Secondary | ICD-10-CM | POA: Insufficient documentation

## 2013-08-11 MED ORDER — IBUPROFEN 800 MG PO TABS
800.0000 mg | ORAL_TABLET | Freq: Once | ORAL | Status: AC
  Administered 2013-08-11: 800 mg via ORAL
  Filled 2013-08-11: qty 1

## 2013-08-11 MED ORDER — IBUPROFEN 600 MG PO TABS: 600.0000 mg | ORAL_TABLET | Freq: Four times a day (QID) | ORAL | Status: DC | PRN

## 2013-08-11 NOTE — ED Provider Notes (Signed)
CSN: [id number]     Arrival date & time 08/11/13  1708 History  This chart was scribed for Alicia Chick, MD by Dorothey Baseman, ED Scribe. This patient was seen in room MHT13/MHT13 and the patient's care was started at 7:42 PM.    Chief Complaint  Patient presents with  . Hand Pain   Patient is a 22 y.o. female presenting with hand pain. The history is provided by the patient. No language interpreter was used.  Hand Pain This is a new problem. The current episode started 12 to 24 hours ago. The problem occurs constantly. The problem has not changed since onset.Exacerbated by: movement. Nothing relieves the symptoms. She has tried nothing for the symptoms.   HPI Comments: Lylliana Kitamura is a 22 y.o. female who presents to the Emergency Department complaining of a constant pain to the left hand, left wrist, and left hip onset around 17 hours ago when she states that she fell onto the pavement and landed on the left hand and hip. She reports that the pain is exacerbated with movement. She denies taking any medications at home to manage her symptoms. Patient has no other pertinent medical history.  She has been able to bear weight without difficulty. She states her main concern is the hand and wrist pain.   Past Medical History  Diagnosis Date  . Back pain    History reviewed. No pertinent past surgical history. History reviewed. No pertinent family history. History  Substance Use Topics  . Smoking status: Current Every Day Smoker -- 0.50 packs/day    Types: Cigarettes  . Smokeless tobacco: Not on file  . Alcohol Use: Yes     Comment: occ   OB History   Grav Para Term Preterm Abortions TAB SAB Ect Mult Living                 Review of Systems  Musculoskeletal: Positive for arthralgias and myalgias.  All other systems reviewed and are negative.    Allergies  Review of patient's allergies indicates no known allergies.  Home Medications   Current Outpatient Rx  Name  Route   Sig  Dispense  Refill  . erythromycin ophthalmic ointment      Place a 1/2 inch ribbon of ointment into the lower eyelid of left eye QID x 7 days   3.5 g   0   . estradiol cypionate (DEPO-ESTRADIOL) 5 MG/ML injection   Intramuscular   Inject 2 mg into the muscle every 28 (twenty-eight) days.           Marland Kitchen HYDROcodone-acetaminophen (NORCO/VICODIN) 5-325 MG per tablet   Oral   Take 1 tablet by mouth every 4 (four) hours as needed for pain.   20 tablet   0   . ibuprofen (ADVIL,MOTRIN) 600 MG tablet   Oral   Take 1 tablet (600 mg total) by mouth every 6 (six) hours as needed.   30 tablet   0    BP 106/69  Pulse 88  Temp(Src) 98.3 F (36.8 C) (Oral)  Resp 16  SpO2 100%  Physical Exam  Nursing note and vitals reviewed. Constitutional: She is oriented to person, place, and time. She appears well-developed and well-nourished. No distress.  HENT:  Head: Normocephalic and atraumatic.  Eyes: Conjunctivae are normal.  Neck: Normal range of motion. Neck supple.  Pulmonary/Chest: Effort normal. No respiratory distress.  Abdominal: She exhibits no distension.  Musculoskeletal: Normal range of motion.  Tenderness to palpation on palmar surface  of the left hand. Tenderness to palpation to anatomic snuff box. Hand is distally and neurovascularly intact. MVI.   Neurological: She is alert and oriented to person, place, and time.  Skin: Skin is warm and dry.  Psychiatric: She has a normal mood and affect. Her behavior is normal.    ED Course  Procedures (including critical care time)  DIAGNOSTIC STUDIES:   COORDINATION OF CARE: 7:43 PM- Discussed that x-ray results were negative. Will order an x-ray of the left wrist. Will discharge patient with a wrist splint and pain medication. Advised patient to follow up with the referred hand specialist if symptoms do not improve. Discussed treatment plan with patient at bedside and patient verbalized agreement.   Labs Review Labs Reviewed -  No data to display  Imaging Review Dg Femur Left  08/11/2013   CLINICAL DATA:  Left leg pain following fall  EXAM: LEFT FEMUR - 2 VIEW  COMPARISON:  None.  FINDINGS: No evidence of acute fracture,subluxation or dislocation identified.  No radio-opaque foreign bodies are present.  No focal bony lesions are noted.  The joint spaces are unremarkable.  IMPRESSION: Negative.   Electronically Signed   By: Laveda Abbe M.D.   On: 08/11/2013 18:14   Dg Hand Complete Left  08/11/2013   CLINICAL DATA:  Left hand injury and pain  EXAM: LEFT HAND - COMPLETE 3+ VIEW  COMPARISON:  None  FINDINGS: No evidence of acute fracture,subluxation or dislocation identified.  No radio-opaque foreign bodies are present.  No focal bony lesions are noted.  The joint spaces are unremarkable.  IMPRESSION: Negative.   Electronically Signed   By: Laveda Abbe M.D.   On: 08/11/2013 18:13   Dg Wrist Complete Left  08/11/2013   CLINICAL DATA:  Fall  EXAM: LEFT WRIST - COMPLETE 3+ VIEW  COMPARISON:  None.  FINDINGS: No acute fracture.  No dislocation.  Unremarkable soft tissues.  IMPRESSION: No acute bony pathology.   Electronically Signed   By: Maryclare Bean M.D.   On: 08/11/2013 20:16   EKG Interpretation   None       MDM   1. Wrist pain, acute, left   2. Hand pain, left    Pt presents with c/o left hand pain and left leg pain after fall.  Pt does have tenderness to palpation in anatomic snuffbox- hand films ordered intiially by triage nurse- I added wrist films.  These were both reassuring.  Pt placed in wrist splint, given ibuprofen and information for hand followup.  Discharged with strict return precautions.  Pt agreeable with plan.  I personally performed the services described in this documentation, which was scribed in my presence. The recorded information has been reviewed and is accurate.     Alicia Chick, MD 08/12/13 2051
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
## 2013-09-13 ENCOUNTER — Emergency Department (HOSPITAL_BASED_OUTPATIENT_CLINIC_OR_DEPARTMENT_OTHER)
Admission: EM | Admit: 2013-09-13 | Discharge: 2013-09-13 | Disposition: A | Discharge status: Home / Self Care | Payer: Medicaid Other | Attending: Emergency Medicine | Admitting: Emergency Medicine

## 2013-09-13 ENCOUNTER — Encounter (HOSPITAL_BASED_OUTPATIENT_CLINIC_OR_DEPARTMENT_OTHER): Payer: Self-pay | Admitting: Emergency Medicine

## 2013-09-13 DIAGNOSIS — IMO0002 Reserved for concepts with insufficient information to code with codable children: Secondary | ICD-10-CM | POA: Insufficient documentation

## 2013-09-13 DIAGNOSIS — S0081XA Abrasion of other part of head, initial encounter: Secondary | ICD-10-CM

## 2013-09-13 DIAGNOSIS — Z23 Encounter for immunization: Secondary | ICD-10-CM | POA: Insufficient documentation

## 2013-09-13 DIAGNOSIS — F17200 Nicotine dependence, unspecified, uncomplicated: Secondary | ICD-10-CM | POA: Insufficient documentation

## 2013-09-13 DIAGNOSIS — W503XXA Accidental bite by another person, initial encounter: Secondary | ICD-10-CM

## 2013-09-13 LAB — RAPID HIV SCREEN (WH-MAU): Rapid HIV Screen: NONREACTIVE

## 2013-09-13 MED ORDER — AMOXICILLIN-POT CLAVULANATE 500-125 MG PO TABS: 1.0000 | ORAL_TABLET | Freq: Three times a day (TID) | ORAL | Status: DC

## 2013-09-13 MED ORDER — TETANUS-DIPHTH-ACELL PERTUSSIS 5-2.5-18.5 LF-MCG/0.5 IM SUSP
0.5000 mL | Freq: Once | INTRAMUSCULAR | Status: AC
  Administered 2013-09-13: 0.5 mL via INTRAMUSCULAR
  Filled 2013-09-13: qty 0.5
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
## 2014-05-03 ENCOUNTER — Encounter (HOSPITAL_COMMUNITY): Payer: Self-pay | Admitting: Emergency Medicine

## 2014-05-03 ENCOUNTER — Emergency Department (HOSPITAL_COMMUNITY)
Admission: EM | Admit: 2014-05-03 | Discharge: 2014-05-03 | Discharge status: Against Medical Advice | Payer: Self-pay | Attending: Emergency Medicine | Admitting: Emergency Medicine

## 2014-05-03 ENCOUNTER — Ambulatory Visit (HOSPITAL_COMMUNITY): Payer: Medicaid Other

## 2014-05-03 ENCOUNTER — Emergency Department (HOSPITAL_COMMUNITY): Payer: Self-pay

## 2014-05-03 DIAGNOSIS — S199XXA Unspecified injury of neck, initial encounter: Secondary | ICD-10-CM

## 2014-05-03 DIAGNOSIS — F17200 Nicotine dependence, unspecified, uncomplicated: Secondary | ICD-10-CM | POA: Insufficient documentation

## 2014-05-03 DIAGNOSIS — S0993XA Unspecified injury of face, initial encounter: Secondary | ICD-10-CM | POA: Insufficient documentation

## 2014-05-03 DIAGNOSIS — Y9389 Activity, other specified: Secondary | ICD-10-CM | POA: Insufficient documentation

## 2014-05-03 DIAGNOSIS — S139XXA Sprain of joints and ligaments of unspecified parts of neck, initial encounter: Secondary | ICD-10-CM | POA: Insufficient documentation

## 2014-05-03 DIAGNOSIS — IMO0002 Reserved for concepts with insufficient information to code with codable children: Secondary | ICD-10-CM | POA: Insufficient documentation

## 2014-05-03 DIAGNOSIS — Y92410 Unspecified street and highway as the place of occurrence of the external cause: Secondary | ICD-10-CM | POA: Insufficient documentation

## 2014-05-03 DIAGNOSIS — S161XXA Strain of muscle, fascia and tendon at neck level, initial encounter: Secondary | ICD-10-CM
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
## 2014-05-06 ENCOUNTER — Emergency Department (HOSPITAL_BASED_OUTPATIENT_CLINIC_OR_DEPARTMENT_OTHER): Payer: Self-pay

## 2014-05-06 ENCOUNTER — Emergency Department (HOSPITAL_BASED_OUTPATIENT_CLINIC_OR_DEPARTMENT_OTHER)
Admission: EM | Admit: 2014-05-06 | Discharge: 2014-05-06 | Disposition: A | Discharge status: Home / Self Care | Payer: Self-pay | Attending: Emergency Medicine | Admitting: Emergency Medicine

## 2014-05-06 ENCOUNTER — Encounter (HOSPITAL_BASED_OUTPATIENT_CLINIC_OR_DEPARTMENT_OTHER): Payer: Self-pay | Admitting: Emergency Medicine

## 2014-05-06 DIAGNOSIS — S7002XA Contusion of left hip, initial encounter: Secondary | ICD-10-CM

## 2014-05-06 DIAGNOSIS — S7000XA Contusion of unspecified hip, initial encounter: Secondary | ICD-10-CM | POA: Insufficient documentation

## 2014-05-06 DIAGNOSIS — S39012A Strain of muscle, fascia and tendon of lower back, initial encounter: Secondary | ICD-10-CM

## 2014-05-06 DIAGNOSIS — S335XXA Sprain of ligaments of lumbar spine, initial encounter: Secondary | ICD-10-CM | POA: Insufficient documentation

## 2014-05-06 DIAGNOSIS — S161XXA Strain of muscle, fascia and tendon at neck level, initial encounter: Secondary | ICD-10-CM

## 2014-05-06 DIAGNOSIS — IMO0002 Reserved for concepts with insufficient information to code with codable children: Secondary | ICD-10-CM | POA: Insufficient documentation

## 2014-05-06 DIAGNOSIS — Y9389 Activity, other specified: Secondary | ICD-10-CM | POA: Insufficient documentation

## 2014-05-06 DIAGNOSIS — Y92410 Unspecified street and highway as the place of occurrence of the external cause: Secondary | ICD-10-CM | POA: Insufficient documentation

## 2014-05-06 DIAGNOSIS — S139XXA Sprain of joints and ligaments of unspecified parts of neck, initial encounter: Secondary | ICD-10-CM | POA: Insufficient documentation

## 2014-05-06 DIAGNOSIS — F17200 Nicotine dependence, unspecified, uncomplicated: Secondary | ICD-10-CM | POA: Insufficient documentation

## 2014-05-06 DIAGNOSIS — Z3202 Encounter for pregnancy test, result negative: Secondary | ICD-10-CM | POA: Insufficient documentation

## 2014-05-06 LAB — URINALYSIS, ROUTINE W REFLEX MICROSCOPIC
BILIRUBIN URINE: NEGATIVE
Glucose, UA: NEGATIVE mg/dL
Hgb urine dipstick: NEGATIVE
KETONES UR: NEGATIVE mg/dL
Leukocytes, UA: NEGATIVE
NITRITE: NEGATIVE
Protein, ur: NEGATIVE mg/dL
Specific Gravity, Urine: 1.03 (ref 1.005–1.030)
UROBILINOGEN UA: 2 mg/dL — AB (ref 0.0–1.0)
pH: 6 (ref 5.0–8.0)

## 2014-05-06 LAB — PREGNANCY, URINE: PREG TEST UR: NEGATIVE
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
## 2014-11-13 ENCOUNTER — Emergency Department (HOSPITAL_BASED_OUTPATIENT_CLINIC_OR_DEPARTMENT_OTHER)
Admission: EM | Admit: 2014-11-13 | Discharge: 2014-11-14 | Disposition: A | Discharge status: Home / Self Care | Payer: Medicaid Other | Attending: Emergency Medicine | Admitting: Emergency Medicine

## 2014-11-13 DIAGNOSIS — R0781 Pleurodynia: Secondary | ICD-10-CM

## 2014-11-13 DIAGNOSIS — R071 Chest pain on breathing: Secondary | ICD-10-CM | POA: Insufficient documentation

## 2014-11-13 DIAGNOSIS — Z720 Tobacco use: Secondary | ICD-10-CM | POA: Insufficient documentation

## 2014-11-13 NOTE — ED Notes (Signed)
Pt sts chest pain starting yesterday, later sts started as shoulder pain; pt sts n/v last 2 days; denies diarrhea; hot flashes;

## 2014-11-13 NOTE — ED Notes (Addendum)
C/o rt cp increased w movement x 3 days Denies inj,  Has had n/v in past 2 days in the am

## 2014-11-14 ENCOUNTER — Emergency Department (HOSPITAL_BASED_OUTPATIENT_CLINIC_OR_DEPARTMENT_OTHER): Payer: Medicaid Other

## 2014-11-14 MED ORDER — NAPROXEN 250 MG PO TABS
500.0000 mg | ORAL_TABLET | Freq: Once | ORAL | Status: AC
  Administered 2014-11-14: 500 mg via ORAL
  Filled 2014-11-14: qty 2

## 2014-11-14 MED ORDER — IPRATROPIUM-ALBUTEROL 0.5-2.5 (3) MG/3ML IN SOLN
3.0000 mL | RESPIRATORY_TRACT | Status: DC
  Administered 2014-11-14: 3 mL via RESPIRATORY_TRACT
  Filled 2014-11-14: qty 3

## 2014-11-14 MED ORDER — NAPROXEN SODIUM 275 MG PO TABS: ORAL_TABLET | ORAL | Status: DC

## 2014-11-14 NOTE — ED Provider Notes (Signed)
CSN: [id number]     Arrival date & time 11/13/14  2255 History   First MD Initiated Contact with Patient 11/14/14 0104     Chief Complaint  Patient presents with  . Chest Pain     (Consider location/radiation/quality/duration/timing/severity/associated sxs/prior Treatment) HPI  This is a 24 year old female with a 2 day history of right lower chest pain. The pain is worse with deep breathing, coughing, laughing or anything that causes her to move on the inside. It is not affected by palpation on the outside. When asked to characterize the pain she replied "I can't really describe it". She states it was "really bad" on her way here but "it's not so bad right now". She had nausea and vomiting yesterday morning but no diarrhea. She denies fever, chills, cough or cold symptoms. She states she is somewhat short of breath.  Past Medical History  Diagnosis Date  . Back pain    No past surgical history on file. No family history on file. History  Substance Use Topics  . Smoking status: Current Every Day Smoker -- 0.50 packs/day    Types: Cigarettes  . Smokeless tobacco: Not on file  . Alcohol Use: Yes     Comment: occ   OB History    No data available     Review of Systems  All other systems reviewed and are negative.   Allergies  Review of patient's allergies indicates no known allergies.  Home Medications   Prior to Admission medications   Not on File   BP 115/85 mmHg  Pulse 72  Temp(Src) 98.2 F (36.8 C) (Oral)  Resp 18  Ht 5\' 3"  (1.6 m)  Wt 130 lb (58.968 kg)  BMI 23.03 kg/m2  SpO2 100%  LMP 11/11/2014   Physical Exam  General: Well-developed, well-nourished female in no acute distress; appearance consistent with age of record HENT: normocephalic; atraumatic Eyes: pupils equal, round and reactive to light; extraocular muscles intact Neck: supple Heart: regular rate and rhythm Lungs: clear to auscultation bilaterally; decreased air movement right base Abdomen:  soft; nondistended; nontender; no masses or hepatosplenomegaly; bowel sounds present Extremities: No deformity; full range of motion; pulses normal; no edema Neurologic: Awake, alert and oriented; motor function intact in all extremities and symmetric; no facial droop Skin: Warm and dry Psychiatric: Flat affect   ED Course  Procedures (including critical care time)   MDM  Nursing notes and vitals signs, including pulse oximetry, reviewed.  Summary of this visit's results, reviewed by myself:  Imaging Studies: Dg Chest 2 View  11/14/2014   CLINICAL DATA:  Right chest pain with movement.  No known injury.  EXAM: CHEST  2 VIEW  COMPARISON:  02/12/2010  FINDINGS: Normal heart size and mediastinal contours. No acute infiltrate or edema. No effusion or pneumothorax. No acute osseous findings.  IMPRESSION: Negative chest   Electronically Signed   By: Marnee SpringJonathon  Watts M.D.   On: 11/14/2014 01:13   Improved air movement after albuterol neb treatment. PERC negative. Geneva score 0.    Carlisle BeersJohn L Ko Bardon, MD 11/14/14 0230

## 2015-03-18 ENCOUNTER — Encounter (HOSPITAL_BASED_OUTPATIENT_CLINIC_OR_DEPARTMENT_OTHER): Payer: Self-pay

## 2015-03-18 ENCOUNTER — Emergency Department (HOSPITAL_BASED_OUTPATIENT_CLINIC_OR_DEPARTMENT_OTHER)
Admission: EM | Admit: 2015-03-18 | Discharge: 2015-03-18 | Disposition: A | Discharge status: Home / Self Care | Payer: Medicaid Other | Attending: Emergency Medicine | Admitting: Emergency Medicine

## 2015-03-18 DIAGNOSIS — Z720 Tobacco use: Secondary | ICD-10-CM | POA: Insufficient documentation

## 2015-03-18 DIAGNOSIS — R197 Diarrhea, unspecified: Secondary | ICD-10-CM | POA: Insufficient documentation

## 2015-03-18 DIAGNOSIS — R112 Nausea with vomiting, unspecified: Secondary | ICD-10-CM | POA: Insufficient documentation

## 2015-03-18 DIAGNOSIS — Z3202 Encounter for pregnancy test, result negative: Secondary | ICD-10-CM | POA: Insufficient documentation

## 2015-03-18 DIAGNOSIS — R1032 Left lower quadrant pain: Secondary | ICD-10-CM | POA: Insufficient documentation

## 2015-03-18 DIAGNOSIS — R1031 Right lower quadrant pain: Secondary | ICD-10-CM | POA: Insufficient documentation

## 2015-03-18 DIAGNOSIS — R630 Anorexia: Secondary | ICD-10-CM | POA: Insufficient documentation

## 2015-03-18 DIAGNOSIS — R1030 Lower abdominal pain, unspecified: Secondary | ICD-10-CM

## 2015-03-18 LAB — COMPREHENSIVE METABOLIC PANEL
ALBUMIN: 4.3 g/dL (ref 3.5–5.0)
ALT: 15 U/L (ref 14–54)
ANION GAP: 9 (ref 5–15)
AST: 22 U/L (ref 15–41)
Alkaline Phosphatase: 63 U/L (ref 38–126)
BILIRUBIN TOTAL: 0.4 mg/dL (ref 0.3–1.2)
BUN: 10 mg/dL (ref 6–20)
CALCIUM: 9 mg/dL (ref 8.9–10.3)
CO2: 25 mmol/L (ref 22–32)
CREATININE: 0.72 mg/dL (ref 0.44–1.00)
Chloride: 106 mmol/L (ref 101–111)
GFR calc Af Amer: >60 mL/min (ref >60)
Glucose, Bld: 75 mg/dL (ref 65–99)
Potassium: 3.5 mmol/L (ref 3.5–5.1)
Sodium: 140 mmol/L (ref 135–145)
Total Protein: 7 g/dL (ref 6.5–8.1)

## 2015-03-18 LAB — URINE MICROSCOPIC-ADD ON

## 2015-03-18 LAB — URINALYSIS, ROUTINE W REFLEX MICROSCOPIC
Bilirubin Urine: NEGATIVE
Glucose, UA: NEGATIVE mg/dL
KETONES UR: 40 mg/dL — AB
LEUKOCYTES UA: NEGATIVE
NITRITE: NEGATIVE
PROTEIN: NEGATIVE mg/dL
Specific Gravity, Urine: 1.027 (ref 1.005–1.030)
Urobilinogen, UA: 1 mg/dL (ref 0.0–1.0)
pH: 7 (ref 5.0–8.0)

## 2015-03-18 LAB — CBC
HEMATOCRIT: 34.3 % — AB (ref 36.0–46.0)
Hemoglobin: 11.3 g/dL — ABNORMAL LOW (ref 12.0–15.0)
MCH: 27.8 pg (ref 26.0–34.0)
MCHC: 32.9 g/dL (ref 30.0–36.0)
MCV: 84.3 fL (ref 78.0–100.0)
Platelets: 290 10*3/uL (ref 150–400)
RBC: 4.07 MIL/uL (ref 3.87–5.11)
RDW: 13.8 % (ref 11.5–15.5)
WBC: 6.9 10*3/uL (ref 4.0–10.5)

## 2015-03-18 LAB — PREGNANCY, URINE: PREG TEST UR: NEGATIVE

## 2015-03-18 LAB — LIPASE, BLOOD: Lipase: 18 U/L — ABNORMAL LOW (ref 22–51)

## 2015-03-18 MED ORDER — ONDANSETRON HCL 4 MG/2ML IJ SOLN
4.0000 mg | Freq: Once | INTRAMUSCULAR | Status: AC
  Administered 2015-03-18: 4 mg via INTRAVENOUS
  Filled 2015-03-18: qty 2

## 2015-03-18 MED ORDER — ONDANSETRON HCL 4 MG PO TABS: 4.0000 mg | ORAL_TABLET | Freq: Four times a day (QID) | ORAL | Status: DC

## 2015-03-18 MED ORDER — SODIUM CHLORIDE 0.9 % IV BOLUS (SEPSIS)
1000.0000 mL | Freq: Once | INTRAVENOUS | Status: AC
  Administered 2015-03-18: 1000 mL via INTRAVENOUS

## 2015-03-18 NOTE — ED Notes (Signed)
Pt reports abd pain, n/v/d x 2 days.  No known fevers.

## 2015-03-18 NOTE — ED Provider Notes (Signed)
CSN: [id number]     Arrival date & time 03/18/15  1729 History   First MD Initiated Contact with Patient 03/18/15 1730     Chief Complaint  Patient presents with  . Abdominal Pain     (Consider location/radiation/quality/duration/timing/severity/associated sxs/prior Treatment) HPI Comments: 24 year old female presenting with abdominal pain, nausea, vomiting and diarrhea 2 days. Patient is unable to specify where the abdominal pain is as she started her menstrual cycle this morning and "cannot differentiate menstrual cramps from the abdominal pain" but believes it radiates across her lower abdomen. Reports ongoing nausea and one episode of nonbloody, nonbilious emesis after drinking a Coke today. She's had about 5 episodes of nonbloody diarrhea over the past 2 days. Has a decreased appetite. No fevers. Denies vaginal discharge or urinary symptoms.  Patient is a 24 y.o. female presenting with abdominal pain. The history is provided by the patient.  Abdominal Pain Associated symptoms: diarrhea, nausea and vomiting     Past Medical History  Diagnosis Date  . Back pain    History reviewed. No pertinent past surgical history. No family history on file. History  Substance Use Topics  . Smoking status: Current Every Day Smoker -- 0.50 packs/day    Types: Cigarettes  . Smokeless tobacco: Not on file  . Alcohol Use: Yes     Comment: occ   OB History    No data available     Review of Systems  Constitutional: Positive for appetite change.  Gastrointestinal: Positive for nausea, vomiting, abdominal pain and diarrhea.  All other systems reviewed and are negative.     Allergies  Review of patient's allergies indicates no known allergies.  Home Medications   Prior to Admission medications   Medication Sig Start Date End Date Taking? Authorizing Provider  naproxen sodium (ANAPROX) 275 MG tablet Take 1 tablet twice daily as needed for chest wall pain. 11/14/14   John Molpus, MD   ondansetron (ZOFRAN) 4 MG tablet Take 1 tablet (4 mg total) by mouth every 6 (six) hours. 03/18/15   Duan Scharnhorst M Shafin Pollio, PA-C   BP 117/68 mmHg  Pulse 100  Temp(Src) 98.5 F (36.9 C) (Oral)  Resp 18  Ht 5\' 3"  (1.6 m)  Wt 128 lb (58.06 kg)  BMI 22.68 kg/m2  SpO2 100%  LMP 03/18/2015 Physical Exam  Constitutional: She is oriented to person, place, and time. She appears well-developed and well-nourished. No distress.  HENT:  Head: Normocephalic and atraumatic.  Mouth/Throat: Oropharynx is clear and moist.  Eyes: Conjunctivae and EOM are normal.  Neck: Normal range of motion. Neck supple.  Cardiovascular: Normal rate, regular rhythm and normal heart sounds.   Pulmonary/Chest: Effort normal and breath sounds normal. No respiratory distress.  Abdominal: Soft. Normal appearance and bowel sounds are normal. She exhibits no distension. There is tenderness in the right lower quadrant, suprapubic area and left lower quadrant. There is no rigidity, no rebound, no guarding, no tenderness at McBurney's point and negative Murphy's sign.  No peritoneal signs.  Musculoskeletal: Normal range of motion. She exhibits no edema.  Neurological: She is alert and oriented to person, place, and time. No sensory deficit.  Skin: Skin is warm and dry.  Psychiatric: She has a normal mood and affect. Her behavior is normal.  Nursing note and vitals reviewed.   ED Course  Procedures (including critical care time) Labs Review Labs Reviewed  URINALYSIS, ROUTINE W REFLEX MICROSCOPIC (NOT AT [facility]) - Abnormal; Notable for the following:    Hgb urine dipstick  LARGE (*)    Ketones, ur 40 (*)    All other components within normal limits  CBC - Abnormal; Notable for the following:    Hemoglobin 11.3 (*)    HCT 34.3 (*)    All other components within normal limits  LIPASE, BLOOD - Abnormal; Notable for the following:    Lipase 18 (*)    All other components within normal limits  URINE MICROSCOPIC-ADD ON - Abnormal;  Notable for the following:    Squamous Epithelial / LPF FEW (*)    All other components within normal limits  PREGNANCY, URINE  COMPREHENSIVE METABOLIC PANEL    Imaging Review No results found.   EKG Interpretation None      MDM   Final diagnoses:  Nausea vomiting and diarrhea  Lower abdominal pain   Nontoxic appearing, NAD. AF VSS. Abdomen is soft with no peritoneal signs. Tenderness across lower abdomen. No tenderness at McBurney's point and negative Murphy's sign. Given both vomiting and diarrhea, unlikely appendicitis. Doubt ovarian torsion. Urine pregnancy negative. Plan to obtain labs, give IV fluids and Zofran and reassess. 5:54 PM.  7:29 PM Labs without acute finding. UA significant for 40 ketones, most likely from mild dehydration. Large amount of blood noted in urine, however patient is on menstrual cycle. After receiving IV fluids and Zofran, patient reports she is no longer nauseated. Still has very mild, lower abdominal cramping. Tolerating PO without difficulty. Stable for discharge. Most likely viral illness. Rx Zofran. Discussed BRAT diet. Return precautions given. Patient states understanding of treatment care plan and is agreeable.  Kathrynn Speed, PA-C 03/18/15 1930  Mirian Mo, MD 03/21/15 [phone]

## 2015-03-18 NOTE — Discharge Instructions (Signed)
Take Zofran as directed as needed for nausea. Rest and stay well hydrated.  Diarrhea Diarrhea is frequent loose and watery bowel movements. It can cause you to feel weak and dehydrated. Dehydration can cause you to become tired and thirsty, have a dry mouth, and have decreased urination that often is dark yellow. Diarrhea is a sign of another problem, most often an infection that will not last long. In most cases, diarrhea typically lasts 2-3 days. However, it can last longer if it is a sign of something more serious. It is important to treat your diarrhea as directed by your caregiver to lessen or prevent future episodes of diarrhea. CAUSES  Some common causes include:  Gastrointestinal infections caused by viruses, bacteria, or parasites.  Food poisoning or food allergies.  Certain medicines, such as antibiotics, chemotherapy, and laxatives.  Artificial sweeteners and fructose.  Digestive disorders. HOME CARE INSTRUCTIONS  Ensure adequate fluid intake (hydration): Have 1 cup (8 oz) of fluid for each diarrhea episode. Avoid fluids that contain simple sugars or sports drinks, fruit juices, whole milk products, and sodas. Your urine should be clear or pale yellow if you are drinking enough fluids. Hydrate with an oral rehydration solution that you can purchase at pharmacies, retail stores, and online. You can prepare an oral rehydration solution at home by mixing the following ingredients together:   - tsp table salt.   tsp baking soda.   tsp salt substitute containing potassium chloride.  1  tablespoons sugar.  1 L (34 oz) of water.  Certain foods and beverages may increase the speed at which food moves through the gastrointestinal (GI) tract. These foods and beverages should be avoided and include:  Caffeinated and alcoholic beverages.  High-fiber foods, such as raw fruits and vegetables, nuts, seeds, and whole grain breads and cereals.  Foods and beverages sweetened with sugar  alcohols, such as xylitol, sorbitol, and mannitol.  Some foods may be well tolerated and may help thicken stool including:  Starchy foods, such as rice, toast, pasta, low-sugar cereal, oatmeal, grits, baked potatoes, crackers, and bagels.  Bananas.  Applesauce.  Add probiotic-rich foods to help increase healthy bacteria in the GI tract, such as yogurt and fermented milk products.  Wash your hands well after each diarrhea episode.  Only take over-the-counter or prescription medicines as directed by your caregiver.  Take a warm bath to relieve any burning or pain from frequent diarrhea episodes. SEEK IMMEDIATE MEDICAL CARE IF:   You are unable to keep fluids down.  You have persistent vomiting.  You have blood in your stool, or your stools are black and tarry.  You do not urinate in 6-8 hours, or there is only a small amount of very dark urine.  You have abdominal pain that increases or localizes.  You have weakness, dizziness, confusion, or light-headedness.  You have a severe headache.  Your diarrhea gets worse or does not get better.  You have a fever or persistent symptoms for more than 2-3 days.  You have a fever and your symptoms suddenly get worse. MAKE SURE YOU:   Understand these instructions.  Will watch your condition.  Will get help right away if you are not doing well or get worse. Document Released: 08/20/2002 Document Revised: 01/14/2014 Document Reviewed: 05/07/2012 [facility] Patient Information 2015 [place], [place]. This information is not intended to replace advice given to you by your health care provider. Make sure you discuss any questions you have with your health care provider.  Food Choices  to Help Relieve Diarrhea When you have diarrhea, the foods you eat and your eating habits are very important. Choosing the right foods and drinks can help relieve diarrhea. Also, because diarrhea can last up to 7 days, you need to replace lost fluids and  electrolytes (such as sodium, potassium, and chloride) in order to help prevent dehydration.  WHAT GENERAL GUIDELINES DO I NEED TO FOLLOW?  Slowly drink 1 cup (8 oz) of fluid for each episode of diarrhea. If you are getting enough fluid, your urine will be clear or pale yellow.  Eat starchy foods. Some good choices include white rice, white toast, pasta, low-fiber cereal, baked potatoes (without the skin), saltine crackers, and bagels.  Avoid large servings of any cooked vegetables.  Limit fruit to two servings per day. A serving is  cup or 1 small piece.  Choose foods with less than 2 g of fiber per serving.  Limit fats to less than 8 tsp (38 g) per day.  Avoid fried foods.  Eat foods that have probiotics in them. Probiotics can be found in certain dairy products.  Avoid foods and beverages that may increase the speed at which food moves through the stomach and intestines (gastrointestinal tract). Things to avoid include:  High-fiber foods, such as dried fruit, raw fruits and vegetables, nuts, seeds, and whole grain foods.  Spicy foods and high-fat foods.  Foods and beverages sweetened with high-fructose corn syrup, honey, or sugar alcohols such as xylitol, sorbitol, and mannitol. WHAT FOODS ARE RECOMMENDED? Grains White rice. White, [place], or pita breads (fresh or toasted), including plain rolls, buns, or bagels. White pasta. Saltine, soda, or graham crackers. Pretzels. Low-fiber cereal. Cooked cereals made with water (such as cornmeal, farina, or cream cereals). Plain muffins. Matzo. Melba toast. Zwieback.  Vegetables Potatoes (without the skin). Strained tomato and vegetable juices. Most well-cooked and canned vegetables without seeds. Tender lettuce. Fruits Cooked or canned applesauce, apricots, cherries, fruit cocktail, grapefruit, peaches, pears, or plums. Fresh bananas, apples without skin, cherries, grapes, cantaloupe, grapefruit, peaches, oranges, or plums.  Meat and  Other Protein Products Baked or boiled chicken. Eggs. Tofu. Fish. Seafood. Smooth peanut butter. Ground or well-cooked tender beef, ham, veal, lamb, pork, or poultry.  Dairy Plain yogurt, kefir, and unsweetened liquid yogurt. Lactose-free milk, buttermilk, or soy milk. Plain hard cheese. Beverages Sport drinks. Clear broths. Diluted fruit juices (except prune). Regular, caffeine-free sodas such as ginger ale. Water. Decaffeinated teas. Oral rehydration solutions. Sugar-free beverages not sweetened with sugar alcohols. Other Bouillon, broth, or soups made from recommended foods.  The items listed above may not be a complete list of recommended foods or beverages. Contact your dietitian for more options. WHAT FOODS ARE NOT RECOMMENDED? Grains Whole grain, whole wheat, bran, or rye breads, rolls, pastas, crackers, and cereals. Wild or brown rice. Cereals that contain more than 2 g of fiber per serving. Corn tortillas or taco shells. Cooked or dry oatmeal. Granola. Popcorn. Vegetables Raw vegetables. Cabbage, broccoli, Brussels sprouts, artichokes, baked beans, beet greens, corn, kale, legumes, peas, sweet potatoes, and yams. Potato skins. Cooked spinach and cabbage. Fruits Dried fruit, including raisins and dates. Raw fruits. Stewed or dried prunes. Fresh apples with skin, apricots, mangoes, pears, raspberries, and strawberries.  Meat and Other Protein Products Chunky peanut butter. Nuts and seeds. Beans and lentils. Tomasa Blase.  Dairy High-fat cheeses. Milk, chocolate milk, and beverages made with milk, such as milk shakes. Cream. Ice cream. Sweets and Desserts Sweet rolls, doughnuts, and sweet breads. Pancakes and  waffles. Fats and Oils Butter. Cream sauces. Margarine. Salad oils. Plain salad dressings. Olives. Avocados.  Beverages Caffeinated beverages (such as coffee, tea, soda, or energy drinks). Alcoholic beverages. Fruit juices with pulp. Prune juice. Soft drinks sweetened with high-fructose  corn syrup or sugar alcohols. Other Coconut. Hot sauce. Chili powder. Mayonnaise. Gravy. Cream-based or milk-based soups.  The items listed above may not be a complete list of foods and beverages to avoid. Contact your dietitian for more information. WHAT SHOULD I DO IF I BECOME DEHYDRATED? Diarrhea can sometimes lead to dehydration. Signs of dehydration include dark urine and dry mouth and skin. If you think you are dehydrated, you should rehydrate with an oral rehydration solution. These solutions can be purchased at pharmacies, retail stores, or online.  Drink -1 cup (120-240 mL) of oral rehydration solution each time you have an episode of diarrhea. If drinking this amount makes your diarrhea worse, try drinking smaller amounts more often. For example, drink 1-3 tsp (5-15 mL) every 5-10 minutes.  A general rule for staying hydrated is to drink 1-2 L of fluid per day. Talk to your health care provider about the specific amount you should be drinking each day. Drink enough fluids to keep your urine clear or pale yellow. Document Released: 11/20/2003 Document Revised: 09/04/2013 Document Reviewed: 07/23/2013 [facility] Patient Information 2015 [place], [place]. This information is not intended to replace advice given to you by your health care provider. Make sure you discuss any questions you have with your health care provider.  Nausea and Vomiting Nausea is a sick feeling that often comes before throwing up (vomiting). Vomiting is a reflex where stomach contents come out of your mouth. Vomiting can cause severe loss of body fluids (dehydration). Children and elderly adults can become dehydrated quickly, especially if they also have diarrhea. Nausea and vomiting are symptoms of a condition or disease. It is important to find the cause of your symptoms. CAUSES   Direct irritation of the stomach lining. This irritation can result from increased acid production (gastroesophageal reflux disease),  infection, food poisoning, taking certain medicines (such as nonsteroidal anti-inflammatory drugs), alcohol use, or tobacco use.  Signals from the brain.These signals could be caused by a headache, heat exposure, an inner ear disturbance, increased pressure in the brain from injury, infection, a tumor, or a concussion, pain, emotional stimulus, or metabolic problems.  An obstruction in the gastrointestinal tract (bowel obstruction).  Illnesses such as diabetes, hepatitis, gallbladder problems, appendicitis, kidney problems, cancer, sepsis, atypical symptoms of a heart attack, or eating disorders.  Medical treatments such as chemotherapy and radiation.  Receiving medicine that makes you sleep (general anesthetic) during surgery. DIAGNOSIS Your caregiver may ask for tests to be done if the problems do not improve after a few days. Tests may also be done if symptoms are severe or if the reason for the nausea and vomiting is not clear. Tests may include:  Urine tests.  Blood tests.  Stool tests.  Cultures (to look for evidence of infection).  X-rays or other imaging studies. Test results can help your caregiver make decisions about treatment or the need for additional tests. TREATMENT You need to stay well hydrated. Drink frequently but in small amounts.You may wish to drink water, sports drinks, clear broth, or eat frozen ice pops or gelatin dessert to help stay hydrated.When you eat, eating slowly may help prevent nausea.There are also some antinausea medicines that may help prevent nausea. HOME CARE INSTRUCTIONS   Take all medicine as directed  by your caregiver.  If you do not have an appetite, do not force yourself to eat. However, you must continue to drink fluids.  If you have an appetite, eat a normal diet unless your caregiver tells you differently.  Eat a variety of complex carbohydrates (rice, wheat, potatoes, bread), lean meats, yogurt, fruits, and vegetables.  Avoid  high-fat foods because they are more difficult to digest.  Drink enough water and fluids to keep your urine clear or pale yellow.  If you are dehydrated, ask your caregiver for specific rehydration instructions. Signs of dehydration may include:  Severe thirst.  Dry lips and mouth.  Dizziness.  Dark urine.  Decreasing urine frequency and amount.  Confusion.  Rapid breathing or pulse. SEEK IMMEDIATE MEDICAL CARE IF:   You have blood or brown flecks (like coffee grounds) in your vomit.  You have black or bloody stools.  You have a severe headache or stiff neck.  You are confused.  You have severe abdominal pain.  You have chest pain or trouble breathing.  You do not urinate at least once every 8 hours.  You develop cold or clammy skin.  You continue to vomit for longer than 24 to 48 hours.  You have a fever. MAKE SURE YOU:   Understand these instructions.  Will watch your condition.  Will get help right away if you are not doing well or get worse. Document Released: 08/30/2005 Document Revised: 11/22/2011 Document Reviewed: 01/27/2011 [facility] Patient Information 2015 [place], [place]. This information is not intended to replace advice given to you by your health care provider. Make sure you discuss any questions you have with your health care provider.  Abdominal Pain, Women Abdominal (stomach, pelvic, or belly) pain can be caused by many things. It is important to tell your doctor:  The location of the pain.  Does it come and go or is it present all the time?  Are there things that start the pain (eating certain foods, exercise)?  Are there other symptoms associated with the pain (fever, nausea, vomiting, diarrhea)? All of this is helpful to know when trying to find the cause of the pain. CAUSES   Stomach: virus or bacteria infection, or ulcer.  Intestine: appendicitis (inflamed appendix), regional ileitis (Crohn's disease), ulcerative colitis (inflamed  colon), irritable bowel syndrome, diverticulitis (inflamed diverticulum of the colon), or cancer of the stomach or intestine.  Gallbladder disease or stones in the gallbladder.  Kidney disease, kidney stones, or infection.  Pancreas infection or cancer.  Fibromyalgia (pain disorder).  Diseases of the female organs:  Uterus: fibroid (non-cancerous) tumors or infection.  Fallopian tubes: infection or tubal pregnancy.  Ovary: cysts or tumors.  Pelvic adhesions (scar tissue).  Endometriosis (uterus lining tissue growing in the pelvis and on the pelvic organs).  Pelvic congestion syndrome (female organs filling up with blood just before the menstrual period).  Pain with the menstrual period.  Pain with ovulation (producing an egg).  Pain with an IUD (intrauterine device, birth control) in the uterus.  Cancer of the female organs.  Functional pain (pain not caused by a disease, may improve without treatment).  Psychological pain.  Depression. DIAGNOSIS  Your doctor will decide the seriousness of your pain by doing an examination.  Blood tests.  X-rays.  Ultrasound.  CT scan (computed tomography, special type of X-ray).  MRI (magnetic resonance imaging).  Cultures, for infection.  Barium enema (dye inserted in the large intestine, to better view it with X-rays).  Colonoscopy (looking in  intestine with a lighted tube).  Laparoscopy (minor surgery, looking in abdomen with a lighted tube).  Major abdominal exploratory surgery (looking in abdomen with a large incision). TREATMENT  The treatment will depend on the cause of the pain.   Many cases can be observed and treated at home.  Over-the-counter medicines recommended by your caregiver.  Prescription medicine.  Antibiotics, for infection.  Birth control pills, for painful periods or for ovulation pain.  Hormone treatment, for endometriosis.  Nerve blocking injections.  Physical  therapy.  Antidepressants.  Counseling with a psychologist or psychiatrist.  Minor or major surgery. HOME CARE INSTRUCTIONS   Do not take laxatives, unless directed by your caregiver.  Take over-the-counter pain medicine only if ordered by your caregiver. Do not take aspirin because it can cause an upset stomach or bleeding.  Try a clear liquid diet (broth or water) as ordered by your caregiver. Slowly move to a bland diet, as tolerated, if the pain is related to the stomach or intestine.  Have a thermometer and take your temperature several times a day, and record it.  Bed rest and sleep, if it helps the pain.  Avoid sexual intercourse, if it causes pain.  Avoid stressful situations.  Keep your follow-up appointments and tests, as your caregiver orders.  If the pain does not go away with medicine or surgery, you may try:  Acupuncture.  Relaxation exercises (yoga, meditation).  Group therapy.  Counseling. SEEK MEDICAL CARE IF:   You notice certain foods cause stomach pain.  Your home care treatment is not helping your pain.  You need stronger pain medicine.  You want your IUD removed.  You feel faint or lightheaded.  You develop nausea and vomiting.  You develop a rash.  You are having side effects or an allergy to your medicine. SEEK IMMEDIATE MEDICAL CARE IF:   Your pain does not go away or gets worse.  You have a fever.  Your pain is felt only in portions of the abdomen. The right side could possibly be appendicitis. The left lower portion of the abdomen could be colitis or diverticulitis.  You are passing blood in your stools (bright red or black tarry stools, with or without vomiting).  You have blood in your urine.  You develop chills, with or without a fever.  You pass out. MAKE SURE YOU:   Understand these instructions.  Will watch your condition.  Will get help right away if you are not doing well or get worse. Document Released:  06/27/2007 Document Revised: 01/14/2014 Document Reviewed: 07/17/2009 [facility] Patient Information 2015 [place], [place]. This information is not intended to replace advice given to you by your health care provider. Make sure you discuss any questions you have with your health care provider.

## 2015-08-16 ENCOUNTER — Emergency Department (HOSPITAL_BASED_OUTPATIENT_CLINIC_OR_DEPARTMENT_OTHER)
Admission: EM | Admit: 2015-08-16 | Discharge: 2015-08-16 | Disposition: A | Discharge status: Home / Self Care | Payer: Medicaid Other | Attending: Emergency Medicine | Admitting: Emergency Medicine

## 2015-08-16 ENCOUNTER — Encounter (HOSPITAL_BASED_OUTPATIENT_CLINIC_OR_DEPARTMENT_OTHER): Payer: Self-pay | Admitting: Emergency Medicine

## 2015-08-16 ENCOUNTER — Emergency Department (HOSPITAL_BASED_OUTPATIENT_CLINIC_OR_DEPARTMENT_OTHER): Payer: Medicaid Other

## 2015-08-16 DIAGNOSIS — R350 Frequency of micturition: Secondary | ICD-10-CM | POA: Insufficient documentation

## 2015-08-16 DIAGNOSIS — N760 Acute vaginitis: Secondary | ICD-10-CM | POA: Insufficient documentation

## 2015-08-16 DIAGNOSIS — Z3202 Encounter for pregnancy test, result negative: Secondary | ICD-10-CM | POA: Insufficient documentation

## 2015-08-16 DIAGNOSIS — F17210 Nicotine dependence, cigarettes, uncomplicated: Secondary | ICD-10-CM | POA: Insufficient documentation

## 2015-08-16 DIAGNOSIS — R300 Dysuria: Secondary | ICD-10-CM | POA: Insufficient documentation

## 2015-08-16 DIAGNOSIS — J029 Acute pharyngitis, unspecified: Secondary | ICD-10-CM | POA: Insufficient documentation

## 2015-08-16 DIAGNOSIS — R1310 Dysphagia, unspecified: Secondary | ICD-10-CM | POA: Insufficient documentation

## 2015-08-16 DIAGNOSIS — B9689 Other specified bacterial agents as the cause of diseases classified elsewhere: Secondary | ICD-10-CM

## 2015-08-16 LAB — CBC WITH DIFFERENTIAL/PLATELET
Band Neutrophils: 0 %
Basophils Absolute: 0.1 10*3/uL (ref 0.0–0.1)
Basophils Relative: 1 %
Blasts: 0 %
EOS PCT: 2 %
Eosinophils Absolute: 0.1 10*3/uL (ref 0.0–0.7)
HEMATOCRIT: 32.7 % — AB (ref 36.0–46.0)
Hemoglobin: 10.3 g/dL — ABNORMAL LOW (ref 12.0–15.0)
LYMPHS PCT: 49 %
Lymphs Abs: 3.3 10*3/uL (ref 0.7–4.0)
MCH: 26.8 pg (ref 26.0–34.0)
MCHC: 31.5 g/dL (ref 30.0–36.0)
MCV: 84.9 fL (ref 78.0–100.0)
MYELOCYTES: 0 %
Metamyelocytes Relative: 0 %
Monocytes Absolute: 0.5 10*3/uL (ref 0.1–1.0)
Monocytes Relative: 8 %
NEUTROS ABS: 2.6 10*3/uL (ref 1.7–7.7)
NEUTROS PCT: 40 %
NRBC: 0 /100{WBCs}
Platelets: 307 10*3/uL (ref 150–400)
Promyelocytes Absolute: 0 %
RBC: 3.85 MIL/uL — AB (ref 3.87–5.11)
RDW: 13.5 % (ref 11.5–15.5)
WBC: 6.6 10*3/uL (ref 4.0–10.5)

## 2015-08-16 LAB — COMPREHENSIVE METABOLIC PANEL
ALK PHOS: 60 U/L (ref 38–126)
ALT: 12 U/L — ABNORMAL LOW (ref 14–54)
ANION GAP: 6 (ref 5–15)
AST: 19 U/L (ref 15–41)
Albumin: 4 g/dL (ref 3.5–5.0)
BILIRUBIN TOTAL: 0.5 mg/dL (ref 0.3–1.2)
BUN: 7 mg/dL (ref 6–20)
CALCIUM: 9.2 mg/dL (ref 8.9–10.3)
CO2: 26 mmol/L (ref 22–32)
Chloride: 104 mmol/L (ref 101–111)
Creatinine, Ser: 0.67 mg/dL (ref 0.44–1.00)
Glucose, Bld: 80 mg/dL (ref 65–99)
Potassium: 3.6 mmol/L (ref 3.5–5.1)
Sodium: 136 mmol/L (ref 135–145)
Total Protein: 7.5 g/dL (ref 6.5–8.1)

## 2015-08-16 LAB — URINALYSIS, ROUTINE W REFLEX MICROSCOPIC
BILIRUBIN URINE: NEGATIVE
Glucose, UA: NEGATIVE mg/dL
HGB URINE DIPSTICK: NEGATIVE
KETONES UR: NEGATIVE mg/dL
Leukocytes, UA: NEGATIVE
Nitrite: NEGATIVE
Protein, ur: NEGATIVE mg/dL
SPECIFIC GRAVITY, URINE: 1.014 (ref 1.005–1.030)
pH: 7.5 (ref 5.0–8.0)

## 2015-08-16 LAB — WET PREP, GENITAL
Sperm: NONE SEEN
Trich, Wet Prep: NONE SEEN
WBC, Wet Prep HPF POC: NONE SEEN
Yeast Wet Prep HPF POC: NONE SEEN

## 2015-08-16 LAB — PREGNANCY, URINE: Preg Test, Ur: NEGATIVE

## 2015-08-16 LAB — RAPID STREP SCREEN (MED CTR MEBANE ONLY): STREPTOCOCCUS, GROUP A SCREEN (DIRECT): NEGATIVE

## 2015-08-16 MED ORDER — FENTANYL CITRATE (PF) 100 MCG/2ML IJ SOLN
50.0000 ug | Freq: Once | INTRAMUSCULAR | Status: AC
  Administered 2015-08-16: 50 ug via INTRAVENOUS
  Filled 2015-08-16: qty 2

## 2015-08-16 MED ORDER — IOHEXOL 300 MG/ML  SOLN
75.0000 mL | Freq: Once | INTRAMUSCULAR | Status: AC | PRN
  Administered 2015-08-16: 75 mL via INTRAVENOUS

## 2015-08-16 MED ORDER — BENZONATATE 100 MG PO CAPS: 100.0000 mg | ORAL_CAPSULE | Freq: Three times a day (TID) | ORAL | Status: DC

## 2015-08-16 MED ORDER — FLUCONAZOLE 200 MG PO TABS: 200.0000 mg | ORAL_TABLET | Freq: Once | ORAL | Status: DC

## 2015-08-16 MED ORDER — DEXAMETHASONE 6 MG PO TABS: 12.0000 mg | ORAL_TABLET | Freq: Once | ORAL | Status: DC

## 2015-08-16 MED ORDER — DEXAMETHASONE SODIUM PHOSPHATE 10 MG/ML IJ SOLN
10.0000 mg | Freq: Once | INTRAMUSCULAR | Status: AC
  Administered 2015-08-16: 10 mg via INTRAVENOUS
  Filled 2015-08-16: qty 1

## 2015-08-16 MED ORDER — METRONIDAZOLE 500 MG PO TABS: 500.0000 mg | ORAL_TABLET | Freq: Two times a day (BID) | ORAL | Status: DC

## 2015-08-16 MED ORDER — LIDOCAINE VISCOUS 2 % MT SOLN
15.0000 mL | Freq: Once | OROMUCOSAL | Status: AC
  Administered 2015-08-16: 15 mL via OROMUCOSAL
  Filled 2015-08-16: qty 15

## 2015-08-16 MED ORDER — SODIUM CHLORIDE 0.9 % IV BOLUS (SEPSIS)
1000.0000 mL | Freq: Once | INTRAVENOUS | Status: AC
  Administered 2015-08-16: 1000 mL via INTRAVENOUS

## 2015-08-16 MED ORDER — CLINDAMYCIN HCL 150 MG PO CAPS: 150.0000 mg | ORAL_CAPSULE | Freq: Four times a day (QID) | ORAL | Status: DC

## 2015-08-16 NOTE — ED Notes (Signed)
States, "ready to go", "feels better", denies sx. texting and drinking at this time. Alert, NAD, calm, interactive, speech clear. Tolerating PO fluids. States, "getting ready to walk out of here, ready to go now". EDP aware.

## 2015-08-16 NOTE — ED Provider Notes (Signed)
CSN: [id number]     Arrival date & time 08/16/15  1437 History  By signing my name below, I, Budd Palmer, attest that this documentation has been prepared under the direction and in the presence of Marily Memos, MD. Electronically Signed: Budd Palmer, ED Scribe. 08/16/2015. 5:17 PM.    Chief Complaint  Patient presents with  . Vaginal Discharge  . Sore Throat   Patient is a 24 y.o. female presenting with vaginal discharge and pharyngitis. The history is provided by the patient. No language interpreter was used.  Vaginal Discharge Quality:  White Severity:  Moderate Onset quality:  Gradual Duration:  3 days Timing:  Constant Progression:  Unchanged Chronicity:  New Relieved by:  Nothing Associated symptoms: abdominal pain, dysuria and urinary frequency   Associated symptoms: no fever   Abdominal pain:    Location:  LLQ and RLQ   Quality:  Cramping   Severity:  Mild   Onset quality:  Gradual   Duration:  3 days   Timing:  Constant   Chronicity:  New Sore Throat This is a new problem. The current episode started more than 2 days ago. The problem occurs constantly. The problem has been gradually worsening. Associated symptoms include abdominal pain. She has tried nothing for the symptoms.   HPI Comments: Alicia Odonnell is a 24 y.o. female smoker at who presents to the Emergency Department complaining of thin, milky vaginal discharge and sore throat onset 3 days ago. She reports associated frequency, dysuria, cramping lower abdominal pain, feeling hot and chilled, difficulty swallowing, and loss of her voice. She notes she has not been able to eat anything or take medication for the past day. She denies a PMHx of the same. She notes she is sexually active with women only. She denies a PMHx of STD's. She notes she recently got out of jail where she was tested for STD's which came back negative. She notes she is not interested in having a pelvic exam and only wants to be tested for  a urinary infection. She notes a PMHx of UTI 1 month ago, for which she was given antibiotics, which caused a yeast infection. Pt denies fever.   Past Medical History  Diagnosis Date  . Back pain    History reviewed. No pertinent past surgical history. History reviewed. No pertinent family history. Social History  Substance Use Topics  . Smoking status: Current Every Day Smoker -- 0.50 packs/day    Types: Cigarettes  . Smokeless tobacco: None  . Alcohol Use: Yes     Comment: occ   OB History    No data available     Review of Systems  Constitutional: Positive for chills. Negative for fever.  HENT: Positive for sore throat, trouble swallowing and voice change.   Gastrointestinal: Positive for abdominal pain.  Genitourinary: Positive for dysuria, frequency and vaginal discharge.  All other systems reviewed and are negative.   Allergies  Review of patient's allergies indicates no known allergies.  Home Medications   Prior to Admission medications   Medication Sig Start Date End Date Taking? Authorizing Provider  benzonatate (TESSALON) 100 MG capsule Take 1 capsule (100 mg total) by mouth every 8 (eight) hours. 08/16/15   Marily Memos, MD  clindamycin (CLEOCIN) 150 MG capsule Take 1 capsule (150 mg total) by mouth every 6 (six) hours. 08/16/15   Marily Memos, MD  dexamethasone (DECADRON) 6 MG tablet Take 2 tablets (12 mg total) by mouth once. 08/19/15   Marily Memos, MD  fluconazole (DIFLUCAN) 200 MG tablet Take 1 tablet (200 mg total) by mouth once. 08/16/15   Marily Memos, MD  metroNIDAZOLE (FLAGYL) 500 MG tablet Take 1 tablet (500 mg total) by mouth 2 (two) times daily. One po bid x 7 days 08/16/15   Marily Memos, MD  naproxen sodium (ANAPROX) 275 MG tablet Take 1 tablet twice daily as needed for chest wall pain. 11/14/14   John Molpus, MD  ondansetron (ZOFRAN) 4 MG tablet Take 1 tablet (4 mg total) by mouth every 6 (six) hours. 03/18/15   Robyn M Hess, PA-C   BP 126/95 mmHg  Pulse  75  Temp(Src) 98.3 F (36.8 C) (Oral)  Resp 18  Ht  (1.6 m)  Wt 135 lb (61.236 kg)  BMI 23.92 kg/m2  SpO2 100%  LMP 08/03/2015 Physical Exam  Constitutional: She appears well-developed and well-nourished.  HENT:  Head: Normocephalic and atraumatic.  Eyes: Conjunctivae are normal. Right eye exhibits no discharge. Left eye exhibits no discharge.  Pulmonary/Chest: Effort normal. No respiratory distress.  Genitourinary: Uterus normal. There is no rash or tenderness on the right labia. There is no rash or tenderness on the left labia. No erythema or tenderness in the vagina. Vaginal discharge found.  Neurological: She is alert. Coordination normal.  Skin: Skin is warm and dry. No rash noted. She is not diaphoretic. No erythema.  Psychiatric: She has a normal mood and affect.  Nursing note and vitals reviewed.   ED Course  Procedures  DIAGNOSTIC STUDIES: Oxygen Saturation is 100% on RA, normal by my interpretation.    COORDINATION OF CARE: 5:14 PM - Discussed normal urinalysis results. Discussed plans to order diagnostic studies and imaging, a steroid injection, and antibiotics. Advised pt to f/u with an ENT specialist.  Pt advised of plan for treatment and pt agrees.  Labs Review Labs Reviewed  WET PREP, GENITAL - Abnormal; Notable for the following:    Clue Cells Wet Prep HPF POC PRESENT (*)    All other components within normal limits  CBC WITH DIFFERENTIAL/PLATELET - Abnormal; Notable for the following:    RBC 3.85 (*)    Hemoglobin 10.3 (*)    HCT 32.7 (*)    All other components within normal limits  COMPREHENSIVE METABOLIC PANEL - Abnormal; Notable for the following:    ALT 12 (*)    All other components within normal limits  RAPID STREP SCREEN (NOT AT [facility])  CULTURE, GROUP A STREP  URINALYSIS, ROUTINE W REFLEX MICROSCOPIC (NOT AT [facility])  PREGNANCY, URINE  RPR  HIV ANTIBODY (ROUTINE TESTING)  GC/CHLAMYDIA PROBE AMP ([place]) NOT AT [facility]    Imaging  Review Ct Soft Tissue Neck W Contrast  08/16/2015  CLINICAL DATA:  Odynophagia. Throat soreness. Productive cough. Headache. Clinical concern for retropharyngeal or peritonsillar abscess. EXAM: CT NECK WITH CONTRAST TECHNIQUE: Multidetector CT imaging of the neck was performed using the standard protocol following the bolus administration of intravenous contrast. CONTRAST:  75mL OMNIPAQUE IOHEXOL 300 MG/ML  SOLN COMPARISON:  None. FINDINGS: Pharynx and larynx: No retropharyngeal or peritonsillar fluid collections. There is symmetric prominent thickening and hyperenhancement of the posterior nasopharyngeal mucosal soft tissues up to 15 mm in thickness. There is symmetric mild thickening and hyperenhancement of the posterior oropharyngeal mucosal soft tissues including the bilateral palatine tonsils and vallecular/ base of tongue soft tissues. No discrete mass is seen in the pharynx or larynx. Normal epiglottis. Salivary glands: Within normal limits, with no mass, duct dilation or stones. Thyroid: Within  normal limits. Lymph nodes: Symmetric minimally prominent level II cervical nodes bilaterally, with no cystic changes in the nodes. Vascular: Within normal limits. Limited intracranial: No acute abnormality. Visualized orbits: Within normal limits. Mastoids and visualized paranasal sinuses: Mild to moderate mucosal thickening in the left maxillary sinus. Minimal mucosal thickening in the right maxillary sinus. Partial opacification of the bilateral ethmoidal air cells. No fluid levels in the visualized paranasal sinuses. Visualized mastoid air cells are unopacified. Skeleton: No aggressive appearing focal osseous lesions. Upper chest: No acute consolidative airspace disease or significant pulmonary nodules. IMPRESSION: 1. No retropharyngeal or peritonsillar abscess. 2. Symmetric diffuse thickening and hyperenhancement of the nasopharyngeal mucosal soft tissues, nonspecific, likely reactive. If the patient's symptoms  do not resolve following medical therapy, ENT referral would be indicated. 3. Symmetric minimally prominent bilateral level 2 cervical lymph nodes, likely reactive. 4. Mild to moderate paranasal sinusitis. Electronically Signed   By: Delbert PhenixJason A Poff M.D.   On: 08/16/2015 18:38   I have personally reviewed and evaluated these images and lab results as part of my medical decision-making.   EKG Interpretation None      MDM   Final diagnoses:  Bacterial vaginosis   Vaginal discharge and odynophagia. Is only sexually active with women and keeps up-to-date on STD checks and has not had a seizure recently. Slight prominence of left peritonsillar area so CT scan done to evaluate for evidence of abscess or retropharyngeal abscess and this was negative. Steroids and lidocaine given to the patient with improved symptoms. Able tolerate by mouth while in the emergency department. Also with bacterial vaginosis so be treated with antibiotics. Will return here for any new or worsening symptoms or difficulty swallowing.   I personally performed the services described in this documentation, which was scribed in my presence. The recorded information has been reviewed and is accurate.    Marily MemosJason Emaan Gary, MD 08/17/15 [phone]

## 2015-08-16 NOTE — ED Notes (Signed)
Cont on cont POX with int NBP

## 2015-08-16 NOTE — ED Notes (Signed)
Patient states that she has a Sore throat and Milky discharge from her vagina x 2 -3 days

## 2015-08-16 NOTE — ED Notes (Signed)
Safety measures in place 

## 2015-08-16 NOTE — ED Notes (Signed)
Pt also c/o possible UTI, approx 1 week. Has had some "milky discharge from vagina" and now presents with cramping

## 2015-08-16 NOTE — ED Notes (Signed)
Pt states she feels much better, noted to have improved speech and able to swallow easier

## 2015-08-16 NOTE — ED Notes (Signed)
Pt placed on cont POX with int NBP secondary to IV narcotic administeration

## 2015-08-16 NOTE — ED Notes (Signed)
Patient transported to CT via stretcher, sr x 2 up  

## 2015-08-16 NOTE — ED Notes (Addendum)
Sore throat, difficulty swallowing due to throat pain, has prod cough with thick yellow sputum, onset approx 3 days ago. Denies any fever, denies any N/V. Sore throat also is accompanied by HA.

## 2015-08-16 NOTE — ED Notes (Signed)
Pt remains NPO, due to viscous lidocaine administered

## 2015-08-18 LAB — GC/CHLAMYDIA PROBE AMP (~~LOC~~) NOT AT ARMC
CHLAMYDIA, DNA PROBE: NEGATIVE
NEISSERIA GONORRHEA: NEGATIVE

## 2015-08-18 LAB — HIV ANTIBODY (ROUTINE TESTING W REFLEX): HIV Screen 4th Generation wRfx: NONREACTIVE

## 2015-08-18 LAB — RPR: RPR: NONREACTIVE

## 2015-08-19 LAB — CULTURE, GROUP A STREP: Strep A Culture: NEGATIVE

## 2015-08-31 ENCOUNTER — Encounter (HOSPITAL_BASED_OUTPATIENT_CLINIC_OR_DEPARTMENT_OTHER): Payer: Self-pay | Admitting: Emergency Medicine

## 2015-08-31 ENCOUNTER — Emergency Department (HOSPITAL_BASED_OUTPATIENT_CLINIC_OR_DEPARTMENT_OTHER)
Admission: EM | Admit: 2015-08-31 | Discharge: 2015-08-31 | Discharge status: Against Medical Advice | Payer: Medicaid Other | Attending: Emergency Medicine | Admitting: Emergency Medicine

## 2015-08-31 DIAGNOSIS — S199XXA Unspecified injury of neck, initial encounter: Secondary | ICD-10-CM | POA: Insufficient documentation

## 2015-08-31 DIAGNOSIS — S3992XA Unspecified injury of lower back, initial encounter: Secondary | ICD-10-CM | POA: Insufficient documentation

## 2015-08-31 DIAGNOSIS — Y92410 Unspecified street and highway as the place of occurrence of the external cause: Secondary | ICD-10-CM | POA: Insufficient documentation

## 2015-08-31 DIAGNOSIS — S0990XA Unspecified injury of head, initial encounter: Secondary | ICD-10-CM | POA: Insufficient documentation

## 2015-08-31 DIAGNOSIS — Y998 Other external cause status: Secondary | ICD-10-CM | POA: Insufficient documentation

## 2015-08-31 DIAGNOSIS — Y9389 Activity, other specified: Secondary | ICD-10-CM | POA: Insufficient documentation

## 2015-08-31 NOTE — ED Notes (Signed)
Patient called to a room, not found in the waiting room
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
## 2015-08-31 NOTE — ED Notes (Addendum)
Patient states that she is going out to smoke at this time 

## 2015-08-31 NOTE — ED Notes (Signed)
Went out to look for patient possibly sitting in car. No patients seen in cars

## 2015-08-31 NOTE — ED Notes (Signed)
Patient not found in the waiting room.

## 2015-09-14 ENCOUNTER — Emergency Department (HOSPITAL_BASED_OUTPATIENT_CLINIC_OR_DEPARTMENT_OTHER)
Admission: EM | Admit: 2015-09-14 | Discharge: 2015-09-14 | Disposition: A | Discharge status: Home / Self Care | Payer: Medicaid Other | Attending: Emergency Medicine | Admitting: Emergency Medicine

## 2015-09-14 ENCOUNTER — Encounter (HOSPITAL_BASED_OUTPATIENT_CLINIC_OR_DEPARTMENT_OTHER): Payer: Self-pay | Admitting: Emergency Medicine

## 2015-09-14 DIAGNOSIS — B373 Candidiasis of vulva and vagina: Secondary | ICD-10-CM | POA: Insufficient documentation

## 2015-09-14 DIAGNOSIS — B3731 Acute candidiasis of vulva and vagina: Secondary | ICD-10-CM

## 2015-09-14 DIAGNOSIS — F17210 Nicotine dependence, cigarettes, uncomplicated: Secondary | ICD-10-CM | POA: Insufficient documentation

## 2015-09-14 DIAGNOSIS — N644 Mastodynia: Secondary | ICD-10-CM | POA: Insufficient documentation

## 2015-09-14 DIAGNOSIS — Z3202 Encounter for pregnancy test, result negative: Secondary | ICD-10-CM | POA: Insufficient documentation

## 2015-09-14 LAB — PREGNANCY, URINE: PREG TEST UR: NEGATIVE

## 2015-09-14 LAB — URINE MICROSCOPIC-ADD ON

## 2015-09-14 LAB — URINALYSIS, ROUTINE W REFLEX MICROSCOPIC
BILIRUBIN URINE: NEGATIVE
Glucose, UA: NEGATIVE mg/dL
HGB URINE DIPSTICK: NEGATIVE
Ketones, ur: 15 mg/dL — AB
NITRITE: NEGATIVE
PROTEIN: 30 mg/dL — AB
SPECIFIC GRAVITY, URINE: 1.035 — AB (ref 1.005–1.030)
pH: 6.5 (ref 5.0–8.0)

## 2015-09-14 LAB — WET PREP, GENITAL
CLUE CELLS WET PREP: NONE SEEN
Sperm: NONE SEEN
Trich, Wet Prep: NONE SEEN

## 2015-09-14 MED ORDER — NAPROXEN 250 MG PO TABS
500.0000 mg | ORAL_TABLET | Freq: Once | ORAL | Status: AC
  Administered 2015-09-14: 500 mg via ORAL
  Filled 2015-09-14: qty 2

## 2015-09-14 MED ORDER — NAPROXEN 500 MG PO TABS: 500.0000 mg | ORAL_TABLET | Freq: Two times a day (BID) | ORAL | Status: DC | PRN

## 2015-09-14 MED ORDER — FLUCONAZOLE 50 MG PO TABS
150.0000 mg | ORAL_TABLET | Freq: Every day | ORAL | Status: DC
  Administered 2015-09-14: 150 mg via ORAL
  Filled 2015-09-14: qty 1

## 2015-09-14 MED ORDER — NAPROXEN SODIUM 275 MG PO TABS: ORAL_TABLET | ORAL | Status: DC

## 2015-09-14 NOTE — ED Provider Notes (Signed)
CSN: [id number]     Arrival date & time 09/14/15  0303 History   First MD Initiated Contact with Patient 09/14/15 [phone]     Chief Complaint  Patient presents with  . Breast Pain     (Consider location/radiation/quality/duration/timing/severity/associated sxs/prior Treatment) HPI  This is a 25 year old female with a two-week history of breast pain. The pain is located in the upper outer quadrants of both breasts and the lower inner quadrant of the left breast. Pain is moderate and worse with palpation. She also believes she feels a mass in the upper outer quadrant of the left breast. She has had no nipple discharge. There is no associated redness or warmth. She is having a vaginal discharge which she believes is consistent with previously diagnosed bacterial vaginosis.  Past Medical History  Diagnosis Date  . Back pain    History reviewed. No pertinent past surgical history. History reviewed. No pertinent family history. Social History  Substance Use Topics  . Smoking status: Current Every Day Smoker -- 0.50 packs/day    Types: Cigarettes  . Smokeless tobacco: None  . Alcohol Use: Yes     Comment: occ   OB History    No data available     Review of Systems  All other systems reviewed and are negative.   Allergies  Review of patient's allergies indicates no known allergies.  Home Medications   Prior to Admission medications   Medication Sig Start Date End Date Taking? Authorizing Provider  benzonatate (TESSALON) 100 MG capsule Take 1 capsule (100 mg total) by mouth every 8 (eight) hours. 08/16/15   Marily MemosJason Mesner, MD  clindamycin (CLEOCIN) 150 MG capsule Take 1 capsule (150 mg total) by mouth every 6 (six) hours. 08/16/15   Marily MemosJason Mesner, MD  dexamethasone (DECADRON) 6 MG tablet Take 2 tablets (12 mg total) by mouth once. 08/19/15   Marily MemosJason Mesner, MD  fluconazole (DIFLUCAN) 200 MG tablet Take 1 tablet (200 mg total) by mouth once. 08/16/15   Marily MemosJason Mesner, MD  metroNIDAZOLE (FLAGYL)  500 MG tablet Take 1 tablet (500 mg total) by mouth 2 (two) times daily. One po bid x 7 days 08/16/15   Marily MemosJason Mesner, MD  naproxen sodium (ANAPROX) 275 MG tablet Take 1 tablet twice daily as needed for chest wall pain. 11/14/14   Viann Nielson, MD  ondansetron (ZOFRAN) 4 MG tablet Take 1 tablet (4 mg total) by mouth every 6 (six) hours. 03/18/15   Robyn M Hess, PA-C   BP 129/76 mmHg  Pulse 84  Temp(Src) 97.7 F (36.5 C) (Oral)  Resp 18  Ht 5\' 4"  (1.626 m)  Wt 135 lb (61.236 kg)  BMI 23.16 kg/m2  SpO2 100%  LMP 08/03/2015   Physical Exam  General: Well-developed, well-nourished female in no acute distress; appearance consistent with age of record HENT: normocephalic; atraumatic Eyes: pupils equal, round and reactive to light; extraocular muscles intact Neck: supple Heart: regular rate and rhythm Lungs: clear to auscultation bilaterally Breasts: Tanner 5; tenderness of the upper outer quadrants bilaterally and lower inner quadrant on the left; no erythema or warmth; no fluctuant masses; no axillary lymphadenopathy Abdomen: soft; nondistended; nontender; no masses or hepatosplenomegaly; bowel sounds present GU: No CVA tenderness; normal external genitalia; a white vaginal discharge; no vaginal bleeding; no cervical motion tenderness; no adnexal tenderness Extremities: No deformity; full range of motion; pulses normal Neurologic: Awake, alert and oriented; motor function intact in all extremities and symmetric; no facial droop Skin: Warm and dry Psychiatric: Normal  mood and affect    ED Course  Procedures (including critical care time)   MDM  Nursing notes and vitals signs, including pulse oximetry, reviewed.  Summary of this visit's results, reviewed by myself:  Labs:  Results for orders placed or performed during the hospital encounter of 09/14/15 (from the past 24 hour(s))  Urinalysis, Routine w reflex microscopic (not at [facility])     Status: Abnormal   Collection Time: 09/14/15   3:40 AM  Result Value Ref Range   Color, Urine YELLOW YELLOW   APPearance CLOUDY (A) CLEAR   Specific Gravity, Urine 1.035 (H) 1.005 - 1.030   pH 6.5 5.0 - 8.0   Glucose, UA NEGATIVE NEGATIVE mg/dL   Hgb urine dipstick NEGATIVE NEGATIVE   Bilirubin Urine NEGATIVE NEGATIVE   Ketones, ur 15 (A) NEGATIVE mg/dL   Protein, ur 30 (A) NEGATIVE mg/dL   Nitrite NEGATIVE NEGATIVE   Leukocytes, UA SMALL (A) NEGATIVE  Pregnancy, urine     Status: None   Collection Time: 09/14/15  3:40 AM  Result Value Ref Range   Preg Test, Ur NEGATIVE NEGATIVE  Urine microscopic-add on     Status: Abnormal   Collection Time: 09/14/15  3:40 AM  Result Value Ref Range   Squamous Epithelial / LPF 6-30 (A) NONE SEEN   WBC, UA 6-30 0 - 5 WBC/hpf   RBC / HPF 0-5 0 - 5 RBC/hpf   Bacteria, UA MANY (A) NONE SEEN   Urine-Other MUCOUS PRESENT   Wet prep, genital     Status: Abnormal   Collection Time: 09/14/15  3:49 AM  Result Value Ref Range   Yeast Wet Prep HPF POC PRESENT (A) NONE SEEN   Trich, Wet Prep NONE SEEN NONE SEEN   Clue Cells Wet Prep HPF POC NONE SEEN NONE SEEN   WBC, Wet Prep HPF POC MANY (A) NONE SEEN   Sperm NONE SEEN    [facility].   Paula Libra, MD 09/14/15 [phone]

## 2015-09-14 NOTE — Discharge Instructions (Signed)

## 2015-09-14 NOTE — ED Notes (Signed)
Patient states that she has bilateral breast pain x 2 weeks. Now she reports that she has lumps in her breast

## 2015-09-14 NOTE — ED Notes (Signed)
During triage the patient would not get off her phone, she was facetiming a friend. The patient at the compeletion states that she is "about to get up [place] here" because the battery on her phone is low. The patient proceeds to hang up on facetime and call the friend. The patient as this RN is leaving the room states" I was here a few weeks ago and was diagnosed with bacterial vagiosis and I think it had some back so I think I need something for that as well.

## 2015-09-15 NOTE — ED Notes (Signed)
[facility] to state that the patient is requesting to fill a prescription for diflucan from 08/16/2015.  Reviewed chart with Dr. Fredderick PhenixBelfi, no yeast was present in last two prescriptions, instructed not to allow filling.

## 2015-09-16 LAB — GC/CHLAMYDIA PROBE AMP (~~LOC~~) NOT AT ARMC
CHLAMYDIA, DNA PROBE: NEGATIVE
NEISSERIA GONORRHEA: NEGATIVE

## 2015-10-02 ENCOUNTER — Emergency Department (HOSPITAL_BASED_OUTPATIENT_CLINIC_OR_DEPARTMENT_OTHER)
Admission: EM | Admit: 2015-10-02 | Discharge: 2015-10-03 | Disposition: A | Discharge status: Home / Self Care | Payer: Medicaid Other | Attending: Emergency Medicine | Admitting: Emergency Medicine

## 2015-10-02 ENCOUNTER — Encounter (HOSPITAL_BASED_OUTPATIENT_CLINIC_OR_DEPARTMENT_OTHER): Payer: Self-pay | Admitting: Emergency Medicine

## 2015-10-02 DIAGNOSIS — N898 Other specified noninflammatory disorders of vagina: Secondary | ICD-10-CM | POA: Insufficient documentation

## 2015-10-02 DIAGNOSIS — F17210 Nicotine dependence, cigarettes, uncomplicated: Secondary | ICD-10-CM | POA: Insufficient documentation

## 2015-10-02 DIAGNOSIS — H578 Other specified disorders of eye and adnexa: Secondary | ICD-10-CM | POA: Insufficient documentation

## 2015-10-02 DIAGNOSIS — H938X3 Other specified disorders of ear, bilateral: Secondary | ICD-10-CM | POA: Insufficient documentation

## 2015-10-02 DIAGNOSIS — J329 Chronic sinusitis, unspecified: Secondary | ICD-10-CM | POA: Insufficient documentation

## 2015-10-02 DIAGNOSIS — H5713 Ocular pain, bilateral: Secondary | ICD-10-CM | POA: Insufficient documentation

## 2015-10-02 DIAGNOSIS — Z3202 Encounter for pregnancy test, result negative: Secondary | ICD-10-CM | POA: Insufficient documentation

## 2015-10-02 LAB — PREGNANCY, URINE: Preg Test, Ur: NEGATIVE

## 2015-10-02 MED ORDER — DEXAMETHASONE 6 MG PO TABS
10.0000 mg | ORAL_TABLET | Freq: Once | ORAL | Status: AC
  Administered 2015-10-02: 10 mg via ORAL
  Filled 2015-10-02: qty 1

## 2015-10-02 NOTE — ED Notes (Signed)
Pt vaginal discharge. Pt was seen here couple of weeks ago for same and reports no improvement. Pt also c/o sore throat.

## 2015-10-02 NOTE — ED Notes (Signed)
Patient reports sore throat, "itchy" ears.  Patient also reports vaginal discharge which has been going on for "a while".  States that this lessens some after treatment but has not gone completely away.

## 2015-10-02 NOTE — ED Notes (Signed)
Pt called to treatment room with no answer from lobby. Registrations states pt walked outside for a minute.

## 2015-10-02 NOTE — ED Provider Notes (Signed)
CSN: [id number]     Arrival date & time 10/02/15  2103 History  By signing my name below, I, Gonzella Lex, attest that this documentation has been prepared under the direction and in the presence of Leta Baptist, MD. Electronically Signed: Gonzella Lex, Scribe. 10/02/2015. 10:59 PM.  Chief Complaint  Patient presents with  . Vaginal Discharge   The history is provided by the patient. No language interpreter was used.   HPI Comments: Alicia Odonnell is a 25 y.o. female who presents to the Emergency Department complaining of vaginal discharge which she was seen for two weeks ago. Pt was told that she had a yeast infection and was prescribed diflucan for treatment of her symptoms but has not yet noticed much improvement of her symptoms. Patient is sexually active with females. Reports that her last episode of sexual activity was about 2 weeks ago. She says she has also been being seen for IVF for possible pregnancy although this is on hold secondary to financial reasons. She also complains of a sore throat, itching ears, burning eyes, congestion and a productie cough with green sputum onset two days ago. Pt denies fever and chance of pregnancy.   Past Medical History  Diagnosis Date  . Back pain    History reviewed. No pertinent past surgical history. No family history on file. Social History  Substance Use Topics  . Smoking status: Current Every Day Smoker -- 0.50 packs/day    Types: Cigarettes  . Smokeless tobacco: None  . Alcohol Use: Yes     Comment: occ   OB History    No data available     Review of Systems  Constitutional: Negative for fever.  HENT: Positive for congestion and sore throat.        Itching ears  Eyes: Positive for pain.  Respiratory: Positive for cough.   Genitourinary: Positive for vaginal discharge.  All other systems reviewed and are negative.   Allergies  Review of patient's allergies indicates no known allergies.  Home  Medications   Prior to Admission medications   Medication Sig Start Date End Date Taking? Authorizing Provider  naproxen (NAPROSYN) 500 MG tablet Take 1 tablet (500 mg total) by mouth 2 (two) times daily as needed (for breast pain). 09/14/15   John Molpus, MD   BP 115/81 mmHg  Pulse 64  Temp(Src) 97.5 F (36.4 C) (Oral)  Resp 20  Ht  (1.626 m)  Wt 130 lb (58.968 kg)  BMI 22.30 kg/m2  SpO2 100%  LMP 09/21/2015 Physical Exam  Constitutional: She is oriented to person, place, and time. She appears well-developed and well-nourished. No distress.  HENT:  Head: Normocephalic and atraumatic.  Right Ear: Tympanic membrane and external ear normal.  Left Ear: Tympanic membrane and external ear normal.  Nose: Right sinus exhibits maxillary sinus tenderness and frontal sinus tenderness. Left sinus exhibits maxillary sinus tenderness and frontal sinus tenderness.  Mouth/Throat: Oropharynx is clear and moist and mucous membranes are normal. No oropharyngeal exudate, posterior oropharyngeal edema, posterior oropharyngeal erythema or tonsillar abscesses.  Post nasal drip noted over the posterior pharynx.  Poor transillumination of the sinuses.  Eyes: Conjunctivae and EOM are normal. Pupils are equal, round, and reactive to light.  Neck: Normal range of motion. Neck supple.  Cardiovascular: Normal rate, regular rhythm, normal heart sounds and intact distal pulses.   No murmur heard. Pulmonary/Chest: Effort normal and breath sounds normal. No respiratory distress. She has no wheezes. She has no rales.  Abdominal: Soft. She exhibits no distension. There is no tenderness.  Genitourinary: There is lesion (enlarged and inflamed hair follicles) on the right labia. There is lesion (enlarged and inflamed hair follicles) on the left labia. Cervix exhibits discharge (whitish, thin, no foul odor). Cervix exhibits no motion tenderness and no friability. Right adnexum displays no mass, no tenderness and no  fullness. Left adnexum displays no mass, no tenderness and no fullness. Vaginal discharge found.  Chaperone (scribe) was present for exam which was performed with no discomfort or complications.   Musculoskeletal: Normal range of motion. She exhibits no edema or tenderness.  Neurological: She is alert and oriented to person, place, and time.  Skin: Skin is warm and dry. No rash noted. She is not diaphoretic.  Psychiatric: She has a normal mood and affect.  Nursing note and vitals reviewed.   ED Course  Procedures  DIAGNOSTIC STUDIES:    Oxygen Saturation is 100% on RA, normal by my interpretation.   COORDINATION OF CARE:  10:54 PM Will perform pelvic exam. Will administer pt medication. Discussed treatment plan with pt at bedside and pt agreed to plan.   Labs Review Labs Reviewed  WET PREP, GENITAL  HERPES SIMPLEX VIRUS CULTURE  PREGNANCY, URINE  GC/CHLAMYDIA PROBE AMP ([place]) NOT AT [facility]   I have personally reviewed and evaluated these lab results as part of my medical decision-making.  MDM  Patient was seen and evaluated in stable condition. Normal respiratory examination. HEENT examination consistent with likely viral sinusitis. Patient refused chest x-ray at this time does not appear toxic and is without fever so likely low yield. Discharge noted on vaginal exam. Also noted lesions that appear to be irritated or inflamed hair follicles but these were swabbed for atypical presentation of herpes. Patient refused prophylactic treatment for herpes at this time and said she would follow up with the cultures. Cultures for GC and chlamydia also obtained.  Patient was given Rocephin and azithromycin for treatment for possible STDs. Her sinus symptoms were treated with Decadron and Flonase. She was discharged home in stable condition with strict return precautions as well as instruction of follow-up outpatient. She expressed understanding and agreement with plan of care. Final  diagnoses:  Vaginal discharge  Sinusitis, unspecified chronicity, unspecified location    1. Vaginal discharge  2. Sinusitis, viral  I personally performed the services described in this documentation, which was scribed in my presence. The recorded information has been reviewed and is accurate.   Leta Baptist, MD 10/03/15 [phone]

## 2015-10-03 LAB — WET PREP, GENITAL
Clue Cells Wet Prep HPF POC: NONE SEEN
Sperm: NONE SEEN
Trich, Wet Prep: NONE SEEN
WBC, Wet Prep HPF POC: NONE SEEN
Yeast Wet Prep HPF POC: NONE SEEN

## 2015-10-03 LAB — GC/CHLAMYDIA PROBE AMP (~~LOC~~) NOT AT ARMC
CHLAMYDIA, DNA PROBE: NEGATIVE
NEISSERIA GONORRHEA: NEGATIVE

## 2015-10-03 MED ORDER — CEFTRIAXONE SODIUM 250 MG IJ SOLR
250.0000 mg | Freq: Once | INTRAMUSCULAR | Status: AC
  Administered 2015-10-03: 250 mg via INTRAMUSCULAR
  Filled 2015-10-03: qty 250

## 2015-10-03 MED ORDER — AZITHROMYCIN 250 MG PO TABS
1000.0000 mg | ORAL_TABLET | Freq: Once | ORAL | Status: AC
  Administered 2015-10-03: 1000 mg via ORAL
  Filled 2015-10-03: qty 4

## 2015-10-03 MED ORDER — LIDOCAINE HCL (PF) 1 % IJ SOLN
INTRAMUSCULAR | Status: AC
  Administered 2015-10-03: 1.2 mL
  Filled 2015-10-03: qty 5

## 2015-10-03 MED ORDER — ONDANSETRON 4 MG PO TBDP
4.0000 mg | ORAL_TABLET | Freq: Once | ORAL | Status: DC
  Filled 2015-10-03: qty 1

## 2015-10-03 MED ORDER — FLUTICASONE PROPIONATE 50 MCG/ACT NA SUSP
1.0000 | Freq: Every day | NASAL | Status: DC
  Filled 2015-10-03: qty 16

## 2015-10-03 NOTE — Discharge Instructions (Signed)
You were seen today for your headache as well as congestion. These appear to be secondary to a viral sinusitis. Use the nasal spray as directed. He female educations for symptom control. You are also seen today for your concern regarding your vaginal discharge. You were treated for possible STD. You're not treated for herpes but a herpes culture was sent. If this is positive he will get a call and be prescribed medication to treat this. Follow-up with a primary care physician outpatient as well as with an OB/GYN for reevaluation.  Sinusitis, Adult Sinusitis is redness, soreness, and inflammation of the paranasal sinuses. Paranasal sinuses are air pockets within the bones of your face. They are located beneath your eyes, in the middle of your forehead, and above your eyes. In healthy paranasal sinuses, mucus is able to drain out, and air is able to circulate through them by way of your nose. However, when your paranasal sinuses are inflamed, mucus and air can become trapped. This can allow bacteria and other germs to grow and cause infection. Sinusitis can develop quickly and last only a short time (acute) or continue over a long period (chronic). Sinusitis that lasts for more than 12 weeks is considered chronic. CAUSES Causes of sinusitis include:  Allergies.  Structural abnormalities, such as displacement of the cartilage that separates your nostrils (deviated septum), which can decrease the air flow through your nose and sinuses and affect sinus drainage.  Functional abnormalities, such as when the small hairs (cilia) that line your sinuses and help remove mucus do not work properly or are not present. SIGNS AND SYMPTOMS Symptoms of acute and chronic sinusitis are the same. The primary symptoms are pain and pressure around the affected sinuses. Other symptoms include:  Upper toothache.  Earache.  Headache.  Bad breath.  Decreased sense of smell and taste.  A cough, which worsens when you  are lying flat.  Fatigue.  Fever.  Thick drainage from your nose, which often is green and may contain pus (purulent).  Swelling and warmth over the affected sinuses. DIAGNOSIS Your health care provider will perform a physical exam. During your exam, your health care provider may perform any of the following to help determine if you have acute sinusitis or chronic sinusitis:  Look in your nose for signs of abnormal growths in your nostrils (nasal polyps).  Tap over the affected sinus to check for signs of infection.  View the inside of your sinuses using an imaging device that has a light attached (endoscope). If your health care provider suspects that you have chronic sinusitis, one or more of the following tests may be recommended:  Allergy tests.  Nasal culture. A sample of mucus is taken from your nose, sent to a lab, and screened for bacteria.  Nasal cytology. A sample of mucus is taken from your nose and examined by your health care provider to determine if your sinusitis is related to an allergy. TREATMENT Most cases of acute sinusitis are related to a viral infection and will resolve on their own within 10 days. Sometimes, medicines are prescribed to help relieve symptoms of both acute and chronic sinusitis. These may include pain medicines, decongestants, nasal steroid sprays, or saline sprays. However, for sinusitis related to a bacterial infection, your health care provider will prescribe antibiotic medicines. These are medicines that will help kill the bacteria causing the infection. Rarely, sinusitis is caused by a fungal infection. In these cases, your health care provider will prescribe antifungal medicine. For some  cases of chronic sinusitis, surgery is needed. Generally, these are cases in which sinusitis recurs more than 3 times per year, despite other treatments. HOME CARE INSTRUCTIONS  Drink plenty of water. Water helps thin the mucus so your sinuses can drain more  easily.  Use a humidifier.  Inhale steam 3-4 times a day (for example, sit in the bathroom with the shower running).  Apply a warm, moist washcloth to your face 3-4 times a day, or as directed by your health care provider.  Use saline nasal sprays to help moisten and clean your sinuses.  Take medicines only as directed by your health care provider.  If you were prescribed either an antibiotic or antifungal medicine, finish it all even if you start to feel better. SEEK IMMEDIATE MEDICAL CARE IF:  You have increasing pain or severe headaches.  You have nausea, vomiting, or drowsiness.  You have swelling around your face.  You have vision problems.  You have a stiff neck.  You have difficulty breathing.   This information is not intended to replace advice given to you by your health care provider. Make sure you discuss any questions you have with your health care provider.   Document Released: 08/30/2005 Document Revised: 09/20/2014 Document Reviewed: 09/14/2011 Elsevier Interactive Patient Education [employer]

## 2015-10-03 NOTE — ED Notes (Signed)
MD at bedside. 

## 2015-10-04 LAB — HERPES SIMPLEX VIRUS(HSV) DNA BY PCR
HSV 1 DNA: NEGATIVE
HSV 2 DNA: NEGATIVE

## 2015-10-08 ENCOUNTER — Telehealth (HOSPITAL_BASED_OUTPATIENT_CLINIC_OR_DEPARTMENT_OTHER): Payer: Self-pay | Admitting: Emergency Medicine

## 2016-04-14 IMAGING — CT CT NECK W/ CM
3 of 5 series · 12 of 33 positions shown, 14 images · IV contrast (omnipaque)
Comparison: None.

CLINICAL DATA: Odynophagia. Throat soreness. Productive cough.
Headache. Clinical concern for retropharyngeal or peritonsillar
abscess.

EXAM:
CT NECK WITH CONTRAST
TECHNIQUE: Multidetector CT imaging of the neck was performed using the
standard protocol following the bolus administration of intravenous
contrast.
CONTRAST:  75mL OMNIPAQUE IOHEXOL 300 MG/ML  SOLN

[Series 5: neck 2.0 coronal · coronal · 0.52mm/px · 3 of 90 slices shown]
[im 18/90  bone]
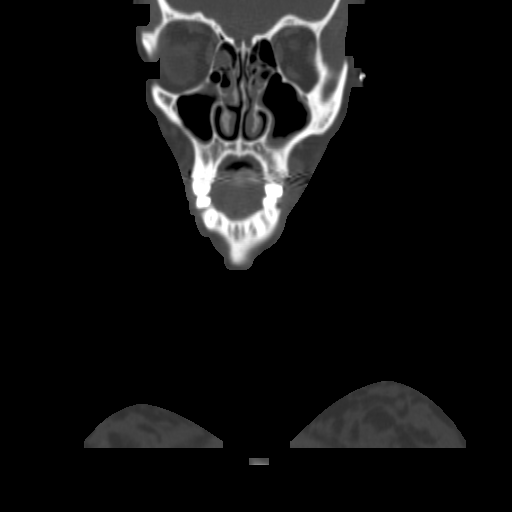
[im 36/90  bone]
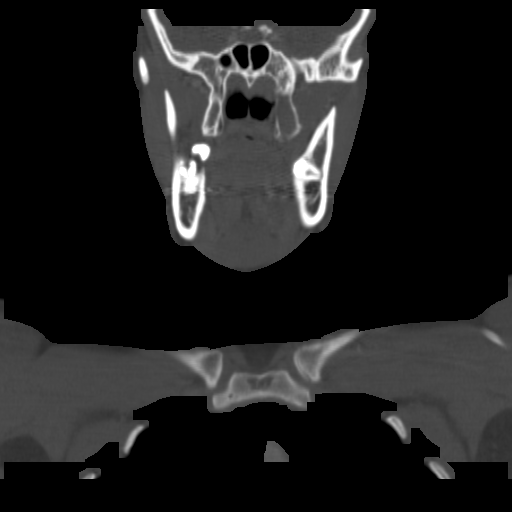
[im 54/90  bone]
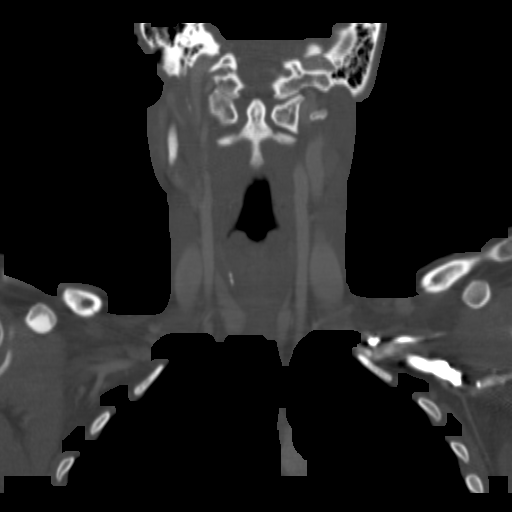

[Series 6: neck 2.0 sagittal · sagittal · 0.49mm/px · 5 of 135 slices shown, 6 images]
[im 45/135  bone]
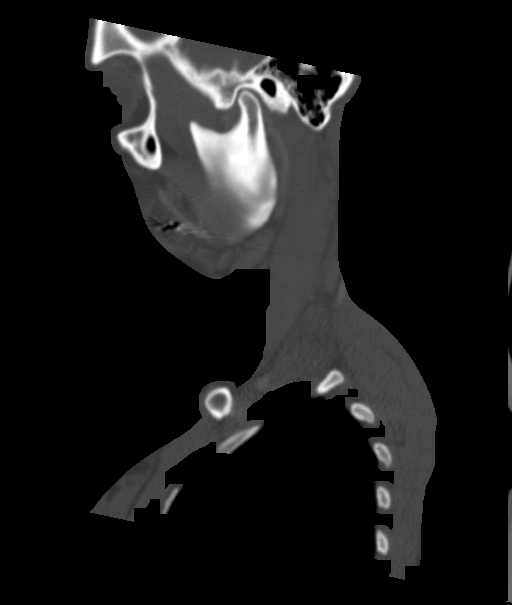
[im 56/135  bone]
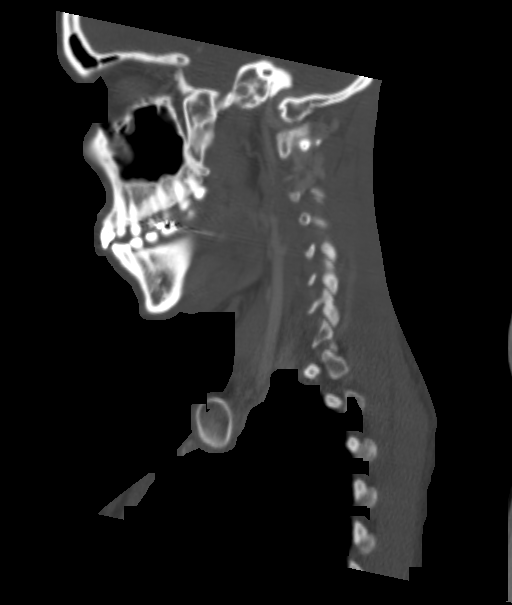
[im 68/135  soft-tissue]
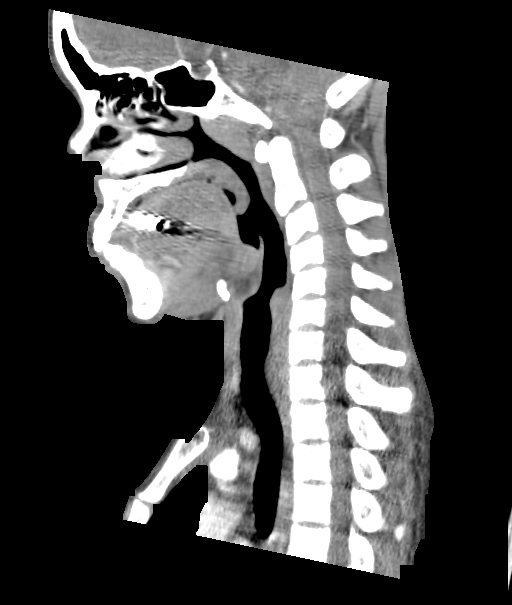
[im 68/135  bone]
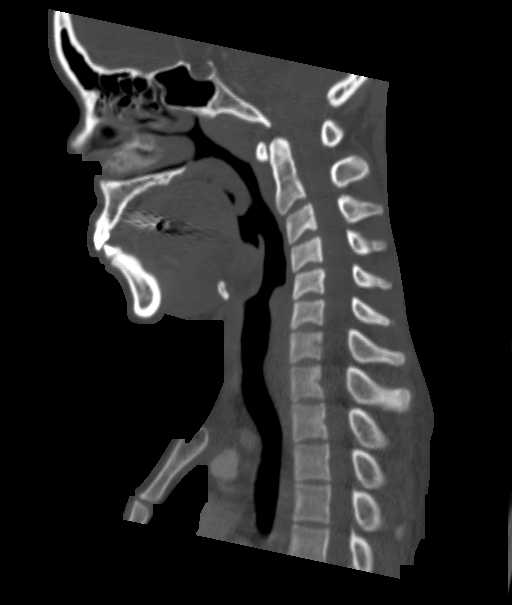
[im 79/135  bone]
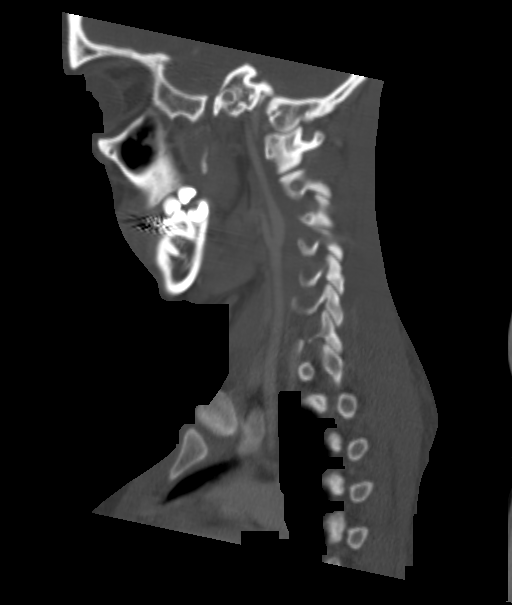
[im 90/135  bone]
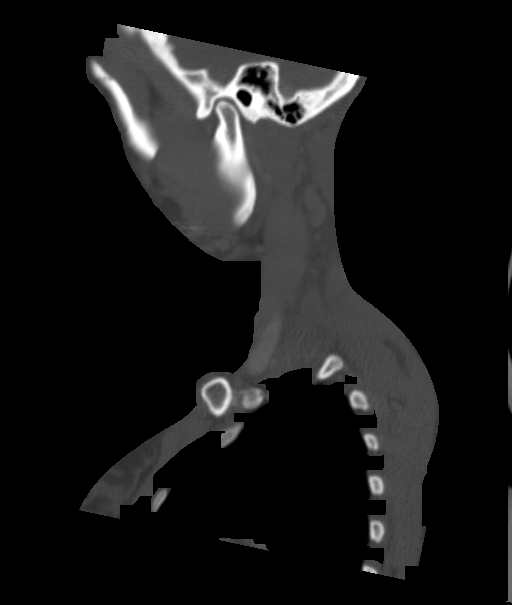

[Series 7: neck 2.0 orth axial to hyoid · axial · 0.50mm/px · z∈[-249,-76]mm · 4 of 134 slices shown, 5 images]
[im 23/134  soft-tissue]
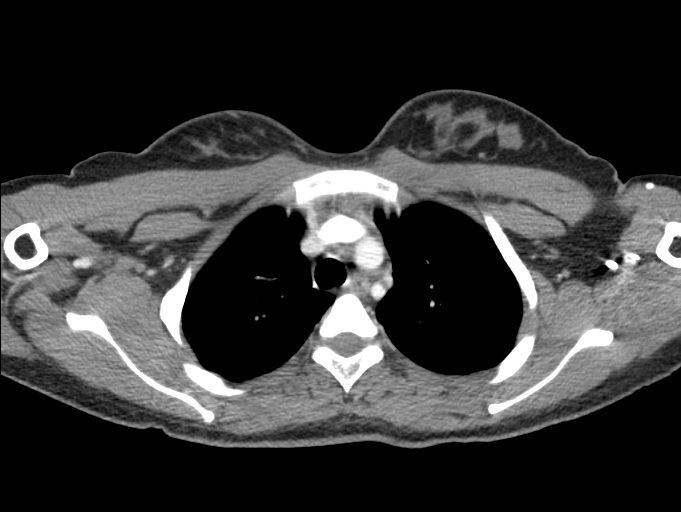
[im 23/134  bone]
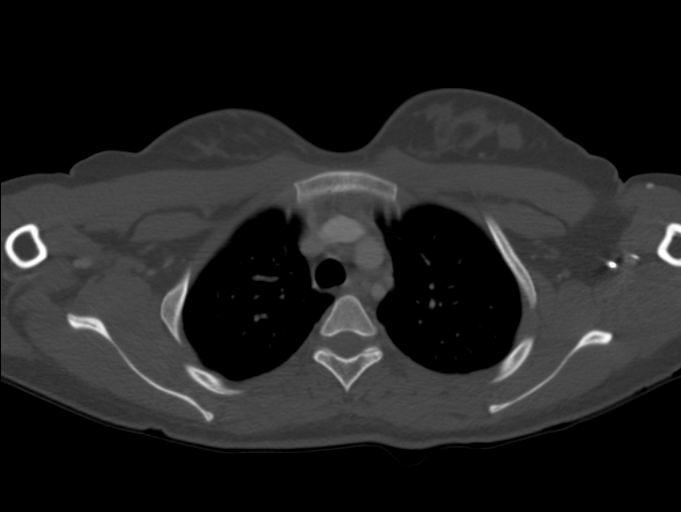
[im 45/134  bone]
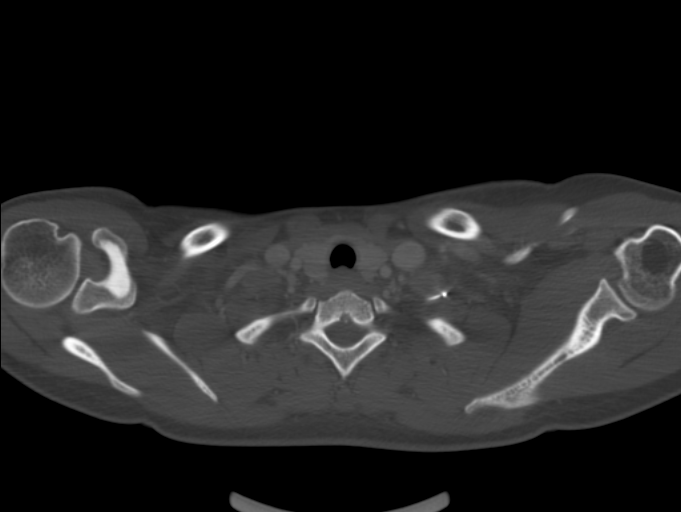
[im 89/134  bone]
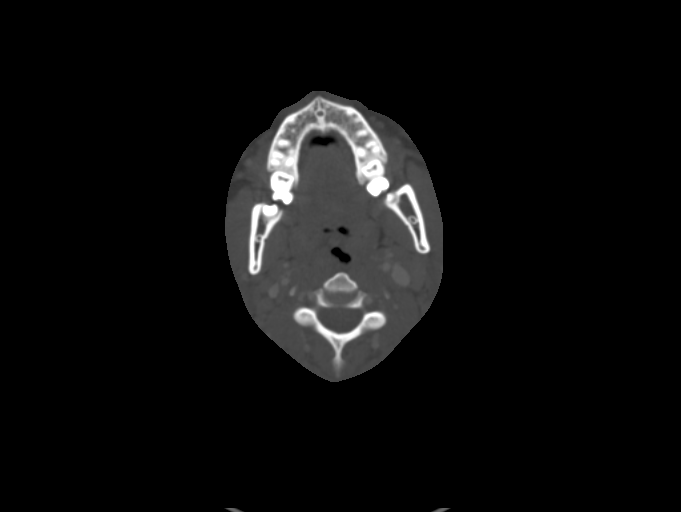
[im 111/134  bone]
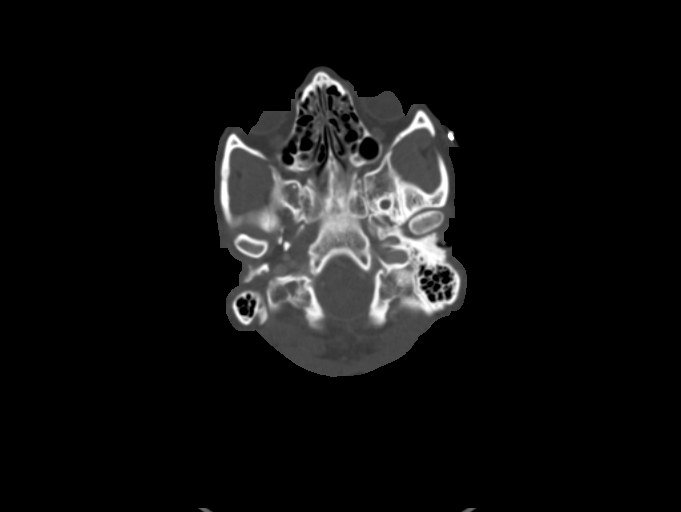

[12 of 33 positions shown; findings below may reference images not displayed]

FINDINGS: Pharynx and larynx: No retropharyngeal or peritonsillar fluid
collections. There is symmetric prominent thickening and
hyperenhancement of the posterior nasopharyngeal mucosal soft
tissues up to 15 mm in thickness. There is symmetric mild thickening
and hyperenhancement of the posterior oropharyngeal mucosal soft
tissues including the bilateral palatine tonsils and vallecular/
base of tongue soft tissues. No discrete mass is seen in the pharynx
or larynx. Normal epiglottis.

Salivary glands: Within normal limits, with no mass, duct dilation
or stones.

Thyroid: Within normal limits.

Lymph nodes: Symmetric minimally prominent level II cervical nodes
bilaterally, with no cystic changes in the nodes.

Vascular: Within normal limits.

Limited intracranial: No acute abnormality.

Visualized orbits: Within normal limits.

Mastoids and visualized paranasal sinuses: Mild to moderate mucosal
thickening in the left maxillary sinus. Minimal mucosal thickening
in the right maxillary sinus. Partial opacification of the bilateral
ethmoidal air cells. No fluid levels in the visualized paranasal
sinuses. Visualized mastoid air cells are unopacified.

Skeleton: No aggressive appearing focal osseous lesions.

Upper chest: No acute consolidative airspace disease or significant
pulmonary nodules.
IMPRESSION: 1. No retropharyngeal or peritonsillar abscess.
2. Symmetric diffuse thickening and hyperenhancement of the
nasopharyngeal mucosal soft tissues, nonspecific, likely reactive.
If the patient's symptoms do not resolve following medical therapy,
ENT referral would be indicated.
3. Symmetric minimally prominent bilateral level 2 cervical lymph
nodes, likely reactive.
4. Mild to moderate paranasal sinusitis.

## 2016-04-29 ENCOUNTER — Encounter (HOSPITAL_BASED_OUTPATIENT_CLINIC_OR_DEPARTMENT_OTHER): Payer: Self-pay

## 2016-04-29 DIAGNOSIS — N760 Acute vaginitis: Secondary | ICD-10-CM | POA: Insufficient documentation

## 2016-04-29 DIAGNOSIS — F17210 Nicotine dependence, cigarettes, uncomplicated: Secondary | ICD-10-CM | POA: Insufficient documentation

## 2016-04-29 LAB — URINALYSIS, ROUTINE W REFLEX MICROSCOPIC
BILIRUBIN URINE: NEGATIVE
GLUCOSE, UA: NEGATIVE mg/dL
HGB URINE DIPSTICK: NEGATIVE
Ketones, ur: NEGATIVE mg/dL
Leukocytes, UA: NEGATIVE
Nitrite: NEGATIVE
Protein, ur: NEGATIVE mg/dL
SPECIFIC GRAVITY, URINE: 1.022 (ref 1.005–1.030)
pH: 7.5 (ref 5.0–8.0)

## 2016-04-29 LAB — URINE MICROSCOPIC-ADD ON
RBC / HPF: NONE SEEN RBC/hpf (ref 0–5)
WBC, UA: NONE SEEN WBC/hpf (ref 0–5)

## 2016-04-29 LAB — PREGNANCY, URINE: PREG TEST UR: NEGATIVE

## 2016-04-29 NOTE — ED Triage Notes (Addendum)
Vaginal d/c x 6 days after using a "diva cup" instead of pads after LMP-NAD-texting during triage-steady gait

## 2016-04-30 ENCOUNTER — Emergency Department (HOSPITAL_BASED_OUTPATIENT_CLINIC_OR_DEPARTMENT_OTHER)
Admission: EM | Admit: 2016-04-30 | Discharge: 2016-04-30 | Disposition: A | Discharge status: Home / Self Care | Payer: Medicaid Other | Attending: Emergency Medicine | Admitting: Emergency Medicine

## 2016-04-30 DIAGNOSIS — B9689 Other specified bacterial agents as the cause of diseases classified elsewhere: Secondary | ICD-10-CM

## 2016-04-30 DIAGNOSIS — N760 Acute vaginitis: Secondary | ICD-10-CM

## 2016-04-30 LAB — WET PREP, GENITAL
SPERM: NONE SEEN
TRICH WET PREP: NONE SEEN
YEAST WET PREP: NONE SEEN

## 2016-04-30 MED ORDER — METRONIDAZOLE 500 MG PO TABS: 500.0000 mg | ORAL_TABLET | Freq: Two times a day (BID) | ORAL | 0 refills | Status: DC

## 2016-04-30 MED ORDER — FLUCONAZOLE 150 MG PO TABS: ORAL_TABLET | ORAL | 0 refills | Status: DC

## 2016-04-30 MED ORDER — METRONIDAZOLE 500 MG PO TABS
500.0000 mg | ORAL_TABLET | Freq: Once | ORAL | Status: AC
  Administered 2016-04-30: 500 mg via ORAL
  Filled 2016-04-30: qty 1

## 2016-04-30 NOTE — ED Provider Notes (Signed)
[facility]   CSN: [id number] Arrival date & time: 04/29/16  2220     History   Chief Complaint Chief Complaint  Patient presents with  . Vaginal Discharge    HPI Alicia Odonnell is a 25 y.o. female who complains of a vaginal discharge for the past 6 days after using a Diva Cup instead of tampons or pads for her last menstrual period. She describes the discharge as thick and white. She denies vulvovaginal discomfort. She denies abdominal pain.  HPI  Past Medical History:  Diagnosis Date  . Back pain     There are no active problems to display for this patient.   History reviewed. No pertinent surgical history.  OB History    No data available       Home Medications    Prior to Admission medications   Medication Sig Start Date End Date Taking? Authorizing Provider  metroNIDAZOLE (FLAGYL) 500 MG tablet Take 1 tablet (500 mg total) by mouth 2 (two) times daily. One po bid x 7 days 04/30/16   Paula LibraJohn Zoeya Gramajo, MD    Family History No family history on file.  Social History Social History  Substance Use Topics  . Smoking status: Current Every Day Smoker    Packs/day: 0.50    Types: Cigarettes  . Smokeless tobacco: Never Used  . Alcohol use Yes     Comment: occ     Allergies   Review of patient's allergies indicates no known allergies.   Review of Systems Review of Systems  All other systems reviewed and are negative.   Physical Exam Updated Vital Signs BP 112/64 (BP Location: Right Arm)   Pulse 93   Temp 99.2 F (37.3 C) (Oral)   Resp 18   Ht 5\' 3"  (1.6 m)   Wt 129 lb (58.5 kg)   LMP 04/18/2016   SpO2 100%   BMI 22.85 kg/m   Physical Exam General: Well-developed, well-nourished female in no acute distress; appearance consistent with age of record HENT: normocephalic; atraumatic Eyes: pupils equal, round and reactive to light; extraocular muscles intact Neck: supple Heart: regular rate and rhythm Lungs: clear to  auscultation bilaterally Abdomen: soft; nondistended; nontender; no masses or hepatosplenomegaly; bowel sounds present GU: Normal external genitalia; no vaginal bleeding; white vaginal discharge; no cervical motion tenderness; no adnexal tenderness Extremities: No deformity; full range of motion; pulses normal Neurologic: Awake, alert and oriented; motor function intact in all extremities and symmetric; no facial droop Skin: Warm and dry Psychiatric: Normal mood and affect    ED Treatments / Results   Nursing notes and vitals signs, including pulse oximetry, reviewed.  Summary of this visit's results, reviewed by myself:  Labs:  Results for orders placed or performed during the hospital encounter of 04/30/16 (from the past 24 hour(s))  Pregnancy, urine     Status: None   Collection Time: 04/29/16 10:35 PM  Result Value Ref Range   Preg Test, Ur NEGATIVE NEGATIVE  Urinalysis, Routine w reflex microscopic ([facility])     Status: Abnormal   Collection Time: 04/29/16 10:35 PM  Result Value Ref Range   Color, Urine YELLOW YELLOW   APPearance TURBID (A) CLEAR   Specific Gravity, Urine 1.022 1.005 - 1.030   pH 7.5 5.0 - 8.0   Glucose, UA NEGATIVE NEGATIVE mg/dL   Hgb urine dipstick NEGATIVE NEGATIVE   Bilirubin Urine NEGATIVE NEGATIVE   Ketones, ur NEGATIVE NEGATIVE mg/dL   Protein, ur NEGATIVE NEGATIVE mg/dL  Nitrite NEGATIVE NEGATIVE   Leukocytes, UA NEGATIVE NEGATIVE  Urine microscopic-add on     Status: Abnormal   Collection Time: 04/29/16 10:35 PM  Result Value Ref Range   Squamous Epithelial / LPF 0-5 (A) NONE SEEN   WBC, UA NONE SEEN 0 - 5 WBC/hpf   RBC / HPF NONE SEEN 0 - 5 RBC/hpf   Bacteria, UA RARE (A) NONE SEEN   Urine-Other AMORPHOUS URATES/PHOSPHATES   Wet prep, genital     Status: Abnormal   Collection Time: 04/30/16  3:27 AM  Result Value Ref Range   Yeast Wet Prep HPF POC NONE SEEN NONE SEEN   Trich, Wet Prep NONE SEEN NONE SEEN   Clue Cells Wet Prep HPF  POC PRESENT (A) NONE SEEN   WBC, Wet Prep HPF POC FEW (A) NONE SEEN   Sperm NONE SEEN     Procedures (including critical care time)  Final Clinical Impressions(s) / ED Diagnoses   Final diagnoses:  BV (bacterial vaginosis)      Paula LibraJohn Charmain Diosdado, MD 04/30/16 [phone]

## 2016-05-03 LAB — GC/CHLAMYDIA PROBE AMP (~~LOC~~) NOT AT ARMC
Chlamydia: NEGATIVE
Neisseria Gonorrhea: NEGATIVE

## 2016-06-25 ENCOUNTER — Emergency Department (HOSPITAL_BASED_OUTPATIENT_CLINIC_OR_DEPARTMENT_OTHER)
Admission: EM | Admit: 2016-06-25 | Discharge: 2016-06-25 | Disposition: A | Discharge status: Home / Self Care | Payer: Medicaid Other | Attending: Emergency Medicine | Admitting: Emergency Medicine

## 2016-06-25 ENCOUNTER — Encounter (HOSPITAL_BASED_OUTPATIENT_CLINIC_OR_DEPARTMENT_OTHER): Payer: Self-pay | Admitting: *Deleted

## 2016-06-25 DIAGNOSIS — B349 Viral infection, unspecified: Secondary | ICD-10-CM | POA: Insufficient documentation

## 2016-06-25 DIAGNOSIS — F17210 Nicotine dependence, cigarettes, uncomplicated: Secondary | ICD-10-CM | POA: Insufficient documentation

## 2016-06-25 MED ORDER — BENZONATATE 100 MG PO CAPS: 100.0000 mg | ORAL_CAPSULE | Freq: Three times a day (TID) | ORAL | 0 refills | Status: DC

## 2016-06-25 MED ORDER — ONDANSETRON 4 MG PO TBDP
4.0000 mg | ORAL_TABLET | Freq: Once | ORAL | Status: AC
Start: 2016-06-25 — End: 2016-06-25
  Administered 2016-06-25: 4 mg via ORAL
  Filled 2016-06-25: qty 1

## 2016-06-25 MED ORDER — ONDANSETRON 4 MG PO TBDP: 4.0000 mg | ORAL_TABLET | Freq: Three times a day (TID) | ORAL | 0 refills | Status: DC | PRN

## 2016-06-25 MED ORDER — IBUPROFEN 800 MG PO TABS: 800.0000 mg | ORAL_TABLET | Freq: Three times a day (TID) | ORAL | 0 refills | Status: DC

## 2016-06-25 NOTE — Discharge Instructions (Signed)
Take the prescribed medication as directed.  These should help your symptoms. Make sure to drink fluids regularly and rest. Follow-up with your primary care doctor. Return to the ED for new or worsening symptoms.

## 2016-06-25 NOTE — ED Triage Notes (Signed)
Pt c/o flu like symptoms , cough congestion , fever, n/v/d and body aches x 1 day

## 2016-06-25 NOTE — ED Provider Notes (Signed)
[facility]   CSN: [id number] Arrival date & time: 06/25/16  1742  By signing my name below, I, Emmanuella Mensah, attest that this documentation has been prepared under the direction and in the presence of Sharilyn Sites, PA-C. Electronically Signed: Angelene Giovanni, ED Scribe. 06/25/16. 6:17 PM.   History   Chief Complaint Chief Complaint  Patient presents with  . Influenza    HPI Comments: Alicia Odonnell is a 25 y.o. female who presents to the Emergency Department complaining of sudden onset of gradually worsening moderate generalized body aches onset today. She reports associated chills, non-productive cough, sore throat, nausea, and malaise. She adds that she has not been able to eat today. No alleviating factors noted. Pt has not tried any medications PTA. She has NKDA. States she never takes flu shots. She also denies any vomiting, rash, sick contacts. Pt is currently febrile but states that she was unaware of her fever PTA.   The history is provided by the patient. No language interpreter was used.    Past Medical History:  Diagnosis Date  . Back pain     There are no active problems to display for this patient.   History reviewed. No pertinent surgical history.  OB History    No data available       Home Medications    Prior to Admission medications   Not on File    Family History No family history on file.  Social History Social History  Substance Use Topics  . Smoking status: Current Every Day Smoker    Packs/day: 0.50    Types: Cigarettes, Cigars  . Smokeless tobacco: Never Used  . Alcohol use Yes     Comment: occ     Allergies   Review of patient's allergies indicates no known allergies.   Review of Systems Review of Systems  Constitutional: Positive for appetite change, chills and fever.  HENT: Positive for sore throat.   Respiratory: Positive for cough.   Gastrointestinal: Positive for nausea. Negative  for vomiting.  Musculoskeletal: Positive for myalgias.  Skin: Negative for rash.  All other systems reviewed and are negative.    Physical Exam Updated Vital Signs BP 119/92   Pulse 100   Temp 100.2 F (37.9 C)   Resp 16   Ht 5\' 3"  (1.6 m)   Wt 128 lb (58.1 kg)   LMP 06/09/2016   SpO2 100%   BMI 22.67 kg/m   Physical Exam  Constitutional: She is oriented to person, place, and time. She appears well-developed and well-nourished.  HENT:  Head: Normocephalic and atraumatic.  Right Ear: Tympanic membrane normal.  Left Ear: Tympanic membrane normal.  Nose: Nose normal.  Mouth/Throat: Uvula is midline, oropharynx is clear and moist and mucous membranes are normal.  Mucous membranes remain moist Tonsils overall normal in appearance bilaterally without exudate; uvula midline without evidence of peritonsillar abscess; handling secretions appropriately; no difficulty swallowing or speaking; normal phonation without stridor  Eyes: Conjunctivae and EOM are normal. Pupils are equal, round, and reactive to light.  Neck: Normal range of motion. No neck rigidity.  Full ROM, no rigidity  Cardiovascular: Normal rate, regular rhythm and normal heart sounds.   Pulmonary/Chest: Effort normal and breath sounds normal.  Lungs clear, no wheezes or rhonchi  Abdominal: Soft. Bowel sounds are normal. There is no tenderness.  Abdomen is soft, benign, nontender  Musculoskeletal: Normal range of motion.  Neurological: She is alert and oriented to person, place, and time.  Skin: Skin is warm and dry.  Psychiatric: She has a normal mood and affect.  Nursing note and vitals reviewed.    ED Treatments / Results  DIAGNOSTIC STUDIES: Oxygen Saturation is 100% on RA, normal by my interpretation.    COORDINATION OF CARE: 6:09 PM- Pt advised of plan for treatment and pt agrees. Pt will receive Zofran.    Labs (all labs ordered are listed, but only abnormal results are displayed) Labs Reviewed - No  data to display  EKG  EKG Interpretation None       Radiology No results found.  Procedures Procedures (including critical care time)  Medications Ordered in ED Medications - No data to display   Initial Impression / Assessment and Plan / ED Course  Sharilyn SitesLisa Eilam [place], PA-C has reviewed the triage vital signs and the nursing notes.  Pertinent labs & imaging results that were available during my care of the patient were reviewed by me and considered in my medical decision making (see chart for details).  Clinical Course   25 year old female who with 1 day history of flulike symptoms. She is mildly febrile here but nontoxic in appearance. Her exam is overall noninfectious. Her mucous membranes remain moist and she does not appear clinically dehydrated. Abdomen is soft and benign. Patient likely with viral illness, possibly flu. She does not take flu shots. Discussed possibility of Tamiflu, however patient states it is too expensive. Will treat symptomatically with zofran, tessalon, and motrin.  Encouraged rest, oral fluids.    At time of discharge, patient is somewhat unhappy that she is not receiving and IV and fluids.  Discussed with her that I do not feel that this is indicated as her vitals are stable, she is not actively vomiting, and does not appear clinically dehydrated.  Patient was offered prescriptions, however she refused to sign paperwork.  [facility]  Final Clinical Impressions(s) / ED Diagnoses   Final diagnoses:  Viral illness    New Prescriptions New Prescriptions   No medications on file   I personally performed the services described in this documentation, which was scribed in my presence. The recorded information has been reviewed and is accurate.   Garlon HatchetLisa M Tanner Vigna, PA-C 06/25/16 1858    Melene Planan Floyd, DO 06/25/16 2055

## 2016-06-25 NOTE — ED Notes (Signed)
Patient states that she is upset because " I know that I am dehydrated and you are not giving me any IV fluids" The patient is visibly angry. Attempted to discuss the treatment.

## 2016-06-25 NOTE — ED Notes (Signed)
Patient states that she does not want the D/c paperwork and she is not going to sign out because she does not agree with the care that she received.

## 2016-06-25 NOTE — ED Notes (Signed)
Went in to reevaluate the patient after the Zofran ODT - patient asked if she tolerate oral fluids. Patient refused the fluids and states " I just want to leave and go to [facility]. They will give me what I need"  NP made aware.

## 2018-09-29 ENCOUNTER — Other Ambulatory Visit: Payer: Self-pay

## 2018-09-29 ENCOUNTER — Encounter (HOSPITAL_BASED_OUTPATIENT_CLINIC_OR_DEPARTMENT_OTHER): Payer: Self-pay | Admitting: *Deleted

## 2018-09-29 ENCOUNTER — Emergency Department (HOSPITAL_BASED_OUTPATIENT_CLINIC_OR_DEPARTMENT_OTHER)
Admission: EM | Admit: 2018-09-29 | Discharge: 2018-09-29 | Disposition: A | Discharge status: Home / Self Care | Payer: Self-pay | Attending: Emergency Medicine | Admitting: Emergency Medicine

## 2018-09-29 DIAGNOSIS — B9689 Other specified bacterial agents as the cause of diseases classified elsewhere: Secondary | ICD-10-CM | POA: Insufficient documentation

## 2018-09-29 DIAGNOSIS — F17290 Nicotine dependence, other tobacco product, uncomplicated: Secondary | ICD-10-CM | POA: Insufficient documentation

## 2018-09-29 DIAGNOSIS — N898 Other specified noninflammatory disorders of vagina: Secondary | ICD-10-CM

## 2018-09-29 DIAGNOSIS — N760 Acute vaginitis: Secondary | ICD-10-CM | POA: Insufficient documentation

## 2018-09-29 DIAGNOSIS — F17210 Nicotine dependence, cigarettes, uncomplicated: Secondary | ICD-10-CM | POA: Insufficient documentation

## 2018-09-29 LAB — URINALYSIS, ROUTINE W REFLEX MICROSCOPIC
BILIRUBIN URINE: NEGATIVE
Glucose, UA: NEGATIVE mg/dL
Hgb urine dipstick: NEGATIVE
Ketones, ur: NEGATIVE mg/dL
Leukocytes, UA: NEGATIVE
NITRITE: NEGATIVE
PH: 7 (ref 5.0–8.0)
Protein, ur: NEGATIVE mg/dL
SPECIFIC GRAVITY, URINE: 1.015 (ref 1.005–1.030)

## 2018-09-29 LAB — PREGNANCY, URINE: Preg Test, Ur: NEGATIVE

## 2018-09-29 LAB — WET PREP, GENITAL
Sperm: NONE SEEN
Trich, Wet Prep: NONE SEEN
Yeast Wet Prep HPF POC: NONE SEEN

## 2018-09-29 MED ORDER — METRONIDAZOLE 500 MG PO TABS: 500.0000 mg | ORAL_TABLET | Freq: Two times a day (BID) | ORAL | 0 refills | Status: DC

## 2018-09-29 NOTE — Discharge Instructions (Signed)
Take Flagyl twice daily for 1 week.  Avoid alcohol while taking this medication as it will make you very sick.  Do not drink alcohol within 24 hours of taking it.  You will be called in 3 days if you test positive for gonorrhea, chlamydia, HIV, or syphilis.  Please follow-up at the health department or your OB/GYN for treatment.  Please return to the emergency department if you develop any new or worsening symptoms.  I recommend establishing care with the OB/GYN either way for an annual exam., but make sure to follow-up if you continue to having the intermittent cramping.  Please return the emergency department he develop any new or worsening symptoms.

## 2018-09-29 NOTE — ED Triage Notes (Signed)
Pt c/o vaginal discharge x 12 days

## 2018-09-30 NOTE — ED Provider Notes (Signed)
[facility] EMERGENCY DEPARTMENT Provider Note   CSN: [id number] Arrival date & time: 09/29/18  1816     History   Chief Complaint Chief Complaint  Patient presents with  . Vaginal Discharge    HPI Alicia Odonnell is a 28 y.o. female who presents with a 12-day history of vaginal discharge and is intermittent pelvic cramping.  Patient was sexually active with a new partner prior.  She denies any consistent abdominal pain.  She also denies any fever, nausea, vomiting, chest pain, shortness of breath, urinary symptoms.  Patient has not tried anything over-the-counter for symptoms.  HPI  Past Medical History:  Diagnosis Date  . Back pain     There are no active problems to display for this patient.   History reviewed. No pertinent surgical history.   OB History   No obstetric history on file.      Home Medications    Prior to Admission medications   Medication Sig Start Date End Date Taking? Authorizing Provider  benzonatate (TESSALON) 100 MG capsule Take 1 capsule (100 mg total) by mouth every 8 (eight) hours. 06/25/16   Garlon HatchetSanders, Lisa M, PA-C  ibuprofen (ADVIL,MOTRIN) 800 MG tablet Take 1 tablet (800 mg total) by mouth 3 (three) times daily. 06/25/16   Garlon HatchetSanders, Lisa M, PA-C  metroNIDAZOLE (FLAGYL) 500 MG tablet Take 1 tablet (500 mg total) by mouth 2 (two) times daily. 09/29/18   Tramya Schoenfelder, Waylan BogaAlexandra M, PA-C  ondansetron (ZOFRAN ODT) 4 MG disintegrating tablet Take 1 tablet (4 mg total) by mouth every 8 (eight) hours as needed for nausea. 06/25/16   Garlon HatchetSanders, Lisa M, PA-C    Family History History reviewed. No pertinent family history.  Social History Social History   Tobacco Use  . Smoking status: Current Every Day Smoker    Packs/day: 0.50    Types: Cigarettes, Cigars  . Smokeless tobacco: Never Used  Substance Use Topics  . Alcohol use: Yes    Comment: occ  . Drug use: Yes     Allergies   Patient has no known allergies.   Review of  Systems Review of Systems  Constitutional: Negative for chills and fever.  HENT: Negative for facial swelling and sore throat.   Respiratory: Negative for shortness of breath.   Cardiovascular: Negative for chest pain.  Gastrointestinal: Negative for abdominal pain, nausea and vomiting.  Genitourinary: Positive for pelvic pain (intermittent) and vaginal discharge. Negative for dysuria and vaginal bleeding.  Musculoskeletal: Negative for back pain.  Skin: Negative for rash and wound.  Neurological: Negative for headaches.  Psychiatric/Behavioral: The patient is not nervous/anxious.      Physical Exam Updated Vital Signs BP 126/84 (BP Location: Right Arm)   Pulse 77   Temp 98.5 F (36.9 C) (Oral)   Resp 18   Ht 5\' 3"  (1.6 m)   Wt 56.7 kg   LMP 09/13/2018   SpO2 100%   BMI 22.14 kg/m   Physical Exam Vitals signs and nursing note reviewed. Exam conducted with a chaperone present.  Constitutional:      General: She is not in acute distress.    Appearance: She is well-developed. She is not diaphoretic.  HENT:     Head: Normocephalic and atraumatic.     Mouth/Throat:     Pharynx: No oropharyngeal exudate.  Eyes:     General: No scleral icterus.       Right eye: No discharge.        Left eye: No discharge.  Conjunctiva/sclera: Conjunctivae normal.     Pupils: Pupils are equal, round, and reactive to light.  Neck:     Musculoskeletal: Normal range of motion and neck supple.     Thyroid: No thyromegaly.  Cardiovascular:     Rate and Rhythm: Normal rate and regular rhythm.     Heart sounds: Normal heart sounds. No murmur. No friction rub. No gallop.   Pulmonary:     Effort: Pulmonary effort is normal. No respiratory distress.     Breath sounds: Normal breath sounds. No stridor. No wheezing or rales.  Abdominal:     General: Bowel sounds are normal. There is no distension.     Palpations: Abdomen is soft.     Tenderness: There is no abdominal tenderness. There is no  guarding or rebound.  Genitourinary:    Vagina: Vaginal discharge (clear/grayish) present.     Cervix: Discharge (clear) present. No cervical motion tenderness.     Uterus: Normal.      Adnexa:        Right: No tenderness.         Left: No tenderness.    Lymphadenopathy:     Cervical: No cervical adenopathy.  Skin:    General: Skin is warm and dry.     Coloration: Skin is not pale.     Findings: No rash.  Neurological:     Mental Status: She is alert.     Coordination: Coordination normal.      ED Treatments / Results  Labs (all labs ordered are listed, but only abnormal results are displayed) Labs Reviewed  WET PREP, GENITAL - Abnormal; Notable for the following components:      Result Value   Clue Cells Wet Prep HPF POC PRESENT (*)    WBC, Wet Prep HPF POC MANY (*)    All other components within normal limits  URINALYSIS, ROUTINE W REFLEX MICROSCOPIC - Abnormal; Notable for the following components:   APPearance CLOUDY (*)    All other components within normal limits  PREGNANCY, URINE  RPR  HIV ANTIBODY (ROUTINE TESTING W REFLEX)  GC/CHLAMYDIA PROBE AMP ([place]) NOT AT [facility]    EKG None  Radiology No results found.  Procedures Procedures (including critical care time)  Medications Ordered in ED Medications - No data to display   Initial Impression / Assessment and Plan / ED Course  I have reviewed the triage vital signs and the nursing notes.  Pertinent labs & imaging results that were available during my care of the patient were reviewed by me and considered in my medical decision making (see chart for details).     Patient presenting with vaginal discharge and intermittent pelvic pain.  Patient has no abdominal tenderness and no pain with pelvic exam.  Wet prep shows clue cells and many WBCs.  GC/chlamydia, HIV, RPR sent and pending.  Urine pregnancy is negative.  UA is negative.  Will treat with Flagyl.  Patient advised not to drink alcohol with  this medication.  There are no signs of PID.  Patient would like to defer treatment for gonorrhea or chlamydia at this time.  Follow-up to OB/GYN for any ongoing symptoms.  Return precautions discussed.  Patient understands and agrees with plan.  Patient vital stable throughout ED course and discharged in satisfactory condition.  Final Clinical Impressions(s) / ED Diagnoses   Final diagnoses:  BV (bacterial vaginosis)  Vaginal discharge    ED Discharge Orders         Ordered  metroNIDAZOLE (FLAGYL) 500 MG tablet  2 times daily,   Status:  Discontinued     09/29/18 2041    metroNIDAZOLE (FLAGYL) 500 MG tablet  2 times daily     09/29/18 2048           Emi Holes, PA-C 09/30/18 1535    Sabas Sous, MD 09/30/18 [phone]

## 2018-10-01 LAB — RPR: RPR Ser Ql: NONREACTIVE

## 2018-10-01 LAB — HIV ANTIBODY (ROUTINE TESTING W REFLEX): HIV Screen 4th Generation wRfx: NONREACTIVE

## 2018-10-02 LAB — GC/CHLAMYDIA PROBE AMP (~~LOC~~) NOT AT ARMC
Chlamydia: NEGATIVE
Neisseria Gonorrhea: NEGATIVE

## 2019-05-04 ENCOUNTER — Emergency Department (HOSPITAL_BASED_OUTPATIENT_CLINIC_OR_DEPARTMENT_OTHER)
Admission: EM | Admit: 2019-05-04 | Discharge: 2019-05-04 | Disposition: A | Discharge status: Home / Self Care | Payer: Self-pay | Attending: Emergency Medicine | Admitting: Emergency Medicine

## 2019-05-04 ENCOUNTER — Other Ambulatory Visit: Payer: Self-pay

## 2019-05-04 ENCOUNTER — Encounter (HOSPITAL_BASED_OUTPATIENT_CLINIC_OR_DEPARTMENT_OTHER): Payer: Self-pay | Admitting: Emergency Medicine

## 2019-05-04 DIAGNOSIS — Z5321 Procedure and treatment not carried out due to patient leaving prior to being seen by health care provider: Secondary | ICD-10-CM | POA: Insufficient documentation

## 2019-05-04 DIAGNOSIS — R509 Fever, unspecified: Secondary | ICD-10-CM | POA: Insufficient documentation

## 2019-05-04 MED ORDER — ACETAMINOPHEN 325 MG PO TABS
650.0000 mg | ORAL_TABLET | Freq: Once | ORAL | Status: AC
  Administered 2019-05-04: 650 mg via ORAL

## 2019-05-04 MED ORDER — ACETAMINOPHEN 325 MG PO TABS
ORAL_TABLET | ORAL | Status: AC
  Administered 2019-05-04: 18:00:00 650 mg via ORAL
  Filled 2019-05-04: qty 2

## 2019-05-04 NOTE — ED Notes (Signed)
Patient's name called in lobby and in bistro area; attempted to call patient on cell phone with no answer to all attempts.

## 2019-05-04 NOTE — ED Triage Notes (Addendum)
Body aches, Headache,fatigue, chills, fever, sore throat, diarrhea, vomiting since yesterday. Sx started yesterday in [place]. Loss of taste today.

## 2020-09-07 ENCOUNTER — Other Ambulatory Visit: Payer: Self-pay

## 2020-09-07 ENCOUNTER — Emergency Department (HOSPITAL_BASED_OUTPATIENT_CLINIC_OR_DEPARTMENT_OTHER)
Admission: EM | Admit: 2020-09-07 | Discharge: 2020-09-07 | Disposition: A | Discharge status: Home / Self Care | Payer: HRSA Program | Attending: Emergency Medicine | Admitting: Emergency Medicine

## 2020-09-07 ENCOUNTER — Encounter (HOSPITAL_BASED_OUTPATIENT_CLINIC_OR_DEPARTMENT_OTHER): Payer: Self-pay | Admitting: Emergency Medicine

## 2020-09-07 DIAGNOSIS — U071 COVID-19: Secondary | ICD-10-CM | POA: Insufficient documentation

## 2020-09-07 DIAGNOSIS — M791 Myalgia, unspecified site: Secondary | ICD-10-CM | POA: Diagnosis not present

## 2020-09-07 DIAGNOSIS — F17210 Nicotine dependence, cigarettes, uncomplicated: Secondary | ICD-10-CM | POA: Insufficient documentation

## 2020-09-07 DIAGNOSIS — R519 Headache, unspecified: Secondary | ICD-10-CM | POA: Diagnosis present

## 2020-09-07 LAB — RESP PANEL BY RT-PCR (FLU A&B, COVID) ARPGX2
Influenza A by PCR: NEGATIVE
Influenza B by PCR: NEGATIVE
SARS Coronavirus 2 by RT PCR: POSITIVE — AB

## 2020-09-07 NOTE — ED Notes (Addendum)
PT called to go back to room, When going through triage PT stated "I want to give water bottle to friend who is back here too." PT informed of NPO protocols and Outside food and drink policy. PT ignored tech to call friend then directed to room when phone call was not answered. PT given gown to change into but did not acknowledge instructions.

## 2020-09-07 NOTE — ED Triage Notes (Signed)
Pt c/o body aches, headache, chills, loss of taste. Pt reports friend tested positive for COVID on Friday. Pt is not vaccinated.

## 2020-09-07 NOTE — ED Provider Notes (Signed)
[facility] EMERGENCY DEPARTMENT Provider Note   CSN: [id number] Arrival date & time: 09/07/20  1446     History Chief Complaint  Patient presents with  . Headache    Alicia Odonnell is a 29 y.o. female with pertinent past medical history of myalgias, headache, chills, anosmia that began yesterday.  Patient states that she had positive contact on Friday for Covid.  Has not been vaccinated.  Denies any cough, shortness of breath or chest pain.  Denies any sore throat, congestion.  Does admit to some slight nausea, no vomiting.  Does admit to some diarrhea.  All of her symptoms started yesterday.  Has not tried anything for this.  States that she has been drinking water, however has not had an appetite since yesterday.  No vision changes or neck pain.  Denies any back pain.  No other complaints.  HPI     Past Medical History:  Diagnosis Date  . Back pain     There are no problems to display for this patient.   History reviewed. No pertinent surgical history.   OB History   No obstetric history on file.     No family history on file.  Social History   Tobacco Use  . Smoking status: Current Every Day Smoker    Packs/day: 0.50    Types: Cigarettes, Cigars  . Smokeless tobacco: Never Used  Vaping Use  . Vaping Use: Never used  Substance Use Topics  . Alcohol use: Yes    Comment: occ  . Drug use: Yes    Types: Marijuana    Home Medications Prior to Admission medications   Medication Sig Start Date End Date Taking? Authorizing Provider  benzonatate (TESSALON) 100 MG capsule Take 1 capsule (100 mg total) by mouth every 8 (eight) hours. 06/25/16   Garlon Hatchet, PA-C  ibuprofen (ADVIL,MOTRIN) 800 MG tablet Take 1 tablet (800 mg total) by mouth 3 (three) times daily. 06/25/16   Garlon Hatchet, PA-C  metroNIDAZOLE (FLAGYL) 500 MG tablet Take 1 tablet (500 mg total) by mouth 2 (two) times daily. 09/29/18   Law, Waylan Boga, PA-C  ondansetron (ZOFRAN ODT) 4  MG disintegrating tablet Take 1 tablet (4 mg total) by mouth every 8 (eight) hours as needed for nausea. 06/25/16   Garlon Hatchet, PA-C    Allergies    Patient has no known allergies.  Review of Systems   Review of Systems  Constitutional: Positive for chills and fever. Negative for diaphoresis and fatigue.  HENT: Negative for congestion, sore throat and trouble swallowing.   Eyes: Negative for pain and visual disturbance.  Respiratory: Negative for cough, shortness of breath and wheezing.   Cardiovascular: Negative for chest pain, palpitations and leg swelling.  Gastrointestinal: Positive for diarrhea and nausea. Negative for abdominal distention, abdominal pain and vomiting.  Genitourinary: Negative for difficulty urinating.  Musculoskeletal: Positive for arthralgias and myalgias. Negative for back pain, neck pain and neck stiffness.  Skin: Negative for pallor.  Neurological: Negative for dizziness, speech difficulty, weakness and headaches.  Psychiatric/Behavioral: Negative for confusion.    Physical Exam Updated Vital Signs BP 116/85 (BP Location: Right Arm)   Pulse 91   Temp 99.2 F (37.3 C) (Oral)   Resp 19   Ht 5\' 2"  (1.575 m)   Wt 68 kg   LMP 09/05/2020   SpO2 99%   BMI 27.44 kg/m   Physical Exam Constitutional:      General: She is not in acute distress.  Appearance: Normal appearance. She is not ill-appearing, toxic-appearing or diaphoretic.     Comments: Patient appears very well, no respiratory distress.  HENT:     Head: Normocephalic and atraumatic.     Mouth/Throat:     Mouth: Mucous membranes are moist.     Pharynx: Oropharynx is clear.  Eyes:     General: No scleral icterus.    Extraocular Movements: Extraocular movements intact.     Pupils: Pupils are equal, round, and reactive to light.  Cardiovascular:     Rate and Rhythm: Normal rate and regular rhythm.     Pulses: Normal pulses.     Heart sounds: Normal heart sounds.  Pulmonary:      Effort: Pulmonary effort is normal. No respiratory distress.     Breath sounds: Normal breath sounds. No stridor. No wheezing, rhonchi or rales.  Chest:     Chest wall: No tenderness.  Abdominal:     General: Abdomen is flat. There is no distension.     Palpations: Abdomen is soft.     Tenderness: There is no abdominal tenderness. There is no guarding or rebound.  Musculoskeletal:        General: No swelling or tenderness. Normal range of motion.     Cervical back: Normal range of motion and neck supple. No rigidity.     Right lower leg: No edema.     Left lower leg: No edema.  Skin:    General: Skin is warm and dry.     Capillary Refill: Capillary refill takes less than 2 seconds.     Coloration: Skin is not pale.  Neurological:     General: No focal deficit present.     Mental Status: She is alert and oriented to person, place, and time.  Psychiatric:        Mood and Affect: Mood normal.        Behavior: Behavior normal.     ED Results / Procedures / Treatments   Labs (all labs ordered are listed, but only abnormal results are displayed) Labs Reviewed  RESP PANEL BY RT-PCR (FLU A&B, COVID) ARPGX2 - Abnormal; Notable for the following components:      Result Value   SARS Coronavirus 2 by RT PCR POSITIVE (*)    All other components within normal limits    EKG None  Radiology No results found.  Procedures Procedures (including critical care time)  Medications Ordered in ED Medications - No data to display  ED Course  I have reviewed the triage vital signs and the nursing notes.  Pertinent labs & imaging results that were available during my care of the patient were reviewed by me and considered in my medical decision making (see chart for details).    MDM Rules/Calculators/A&P                          Alicia Odonnell is a 29 y.o. female with pertinent past medical history of myalgias, headache, chills, anosmia.  Patient with Covid test done in triage, Covid  test was positive when I went to go see patient.  Expressed that we can obtain labs and treat myalgias and nausea with Tylenol and Zofran, however patient refused this.  Patient states that she mainly came here for Covid test, wants to go home.  Patient appears very well, I am okay with this at this time.  Lung exam clear, satting at 100% on room air.  Strict return precautions given.  Covid symptomatic treatment discussed, patient to follow-up with PCP.  Patient refusing Mab infusion.  Doubt need for further emergent work up at this time. I explained the diagnosis and have given explicit precautions to return to the ER including for any other new or worsening symptoms. The patient understands and accepts the medical plan as it's been dictated and I have answered their questions. Discharge instructions concerning home care and prescriptions have been given. The patient is STABLE and is discharged to home in good condition.   Final Clinical Impression(s) / ED Diagnoses Final diagnoses:  COVID    Rx / DC Orders ED Discharge Orders    None       Farrel Gordon, PA-C 09/07/20 1817    Melene Plan, DO 09/07/20 1845

## 2020-09-07 NOTE — Discharge Instructions (Signed)
You are seen today for Covid, please use the following attachments.  Isolate for the next 10 days, follow CDC guidelines.  You can take Tylenol instructed on the bottle for pain.  If you have any new or worsening concerning symptoms, specifically shortness of breath or difficulty breathing please come back to the emergency department.  You can follow-up with the post Covid clear clinic or your primary care doctor over telehealth.

## 2022-04-22 ENCOUNTER — Emergency Department (HOSPITAL_BASED_OUTPATIENT_CLINIC_OR_DEPARTMENT_OTHER)
Admission: EM | Admit: 2022-04-22 | Discharge: 2022-04-22 | Disposition: A | Discharge status: Home / Self Care | Payer: PRIVATE HEALTH INSURANCE | Attending: Emergency Medicine | Admitting: Emergency Medicine

## 2022-04-22 ENCOUNTER — Emergency Department (HOSPITAL_BASED_OUTPATIENT_CLINIC_OR_DEPARTMENT_OTHER): Payer: PRIVATE HEALTH INSURANCE

## 2022-04-22 ENCOUNTER — Encounter (HOSPITAL_BASED_OUTPATIENT_CLINIC_OR_DEPARTMENT_OTHER): Payer: Self-pay | Admitting: Emergency Medicine

## 2022-04-22 DIAGNOSIS — W172XXA Fall into hole, initial encounter: Secondary | ICD-10-CM | POA: Diagnosis not present

## 2022-04-22 DIAGNOSIS — S82142A Displaced bicondylar fracture of left tibia, initial encounter for closed fracture: Secondary | ICD-10-CM | POA: Insufficient documentation

## 2022-04-22 DIAGNOSIS — F17210 Nicotine dependence, cigarettes, uncomplicated: Secondary | ICD-10-CM | POA: Insufficient documentation

## 2022-04-22 DIAGNOSIS — S8992XA Unspecified injury of left lower leg, initial encounter: Secondary | ICD-10-CM | POA: Diagnosis present

## 2022-04-22 DIAGNOSIS — W19XXXA Unspecified fall, initial encounter: Secondary | ICD-10-CM

## 2022-04-22 MED ORDER — HYDROCODONE-ACETAMINOPHEN 10-325 MG PO TABS: 1.0000 | ORAL_TABLET | Freq: Four times a day (QID) | ORAL | 0 refills | Status: AC | PRN

## 2022-04-22 MED ORDER — NAPROXEN 250 MG PO TABS
500.0000 mg | ORAL_TABLET | Freq: Once | ORAL | Status: AC
  Administered 2022-04-22: 500 mg via ORAL
  Filled 2022-04-22: qty 2

## 2022-04-22 NOTE — ED Provider Notes (Signed)
[facility] Provider Note: [name], MD, FACEP  CSN: [id number] MRN: [id number] ARRIVAL: 04/22/22 at 0259 ROOM: [facility]02/[facility]02   CHIEF COMPLAINT  Knee Injury   HISTORY OF PRESENT ILLNESS  04/22/22 3:14 AM Alicia Odonnell is a 31 y.o. female who stepped into a ditch earlier this evening and fell injuring her left knee.  She has swelling in her left knee as well.  She rates her pain as a 10 out of 10, worse with movement or attempted weightbearing.  She describes the pain as throbbing.    Past Medical History:  Diagnosis Date   Back pain     History reviewed. No pertinent surgical history.  Family History  Problem Relation Age of Onset   Hypertension Father    Diabetes Father    Cancer Paternal Grandmother    Hypertension Paternal Grandmother    Diabetes Paternal Grandmother    Heart attack Paternal Grandmother    Heart attack Maternal Grandmother    COPD Maternal Grandmother     Social History   Tobacco Use   Smoking status: Every Day    Packs/day: 0.50    Types: Cigarettes, Cigars   Smokeless tobacco: Never  Vaping Use   Vaping Use: Never used  Substance Use Topics   Alcohol use: Yes    Comment: occ   Drug use: Yes    Types: Marijuana    Prior to Admission medications   Medication Sig Start Date End Date Taking? Authorizing Provider  HYDROcodone-acetaminophen (NORCO) 10-325 MG tablet Take 1 tablet by mouth every 6 (six) hours as needed for severe pain or moderate pain. 04/22/22  Yes Innocence Schlotzhauer, MD    Allergies Patient has no known allergies.   REVIEW OF SYSTEMS  Negative except as noted here or in the History of Present Illness.   PHYSICAL EXAMINATION  Initial Vital Signs Blood pressure 109/74, pulse 96, temperature (!) 97.5 F (36.4 C), temperature source Oral, resp. rate 16, height 5\' 3"  (1.6 m), weight 70.3 kg, SpO2 98 %.  Examination General: Well-developed, well-nourished female in no acute distress; appearance consistent  with age of record HENT: normocephalic; atraumatic Eyes: Normal appearance Neck: supple Heart: regular rate and rhythm Lungs: clear to auscultation bilaterally Abdomen: soft; nondistended; nontender; bowel sounds present Extremities: No deformity; left knee tenderness and mild swelling, pain on anterior and posterior drawer tests, medial and lateral stress, but grossly stable; pulses normal Neurologic: Awake, alert and oriented; motor function intact in all extremities and symmetric; no facial droop Skin: Warm and dry Psychiatric: Normal mood and affect   RESULTS  Summary of this visit's results, reviewed and interpreted by myself:   EKG Interpretation  Date/Time:    Ventricular Rate:    PR Interval:    QRS Duration:   QT Interval:    QTC Calculation:   R Axis:     Text Interpretation:         Laboratory Studies: No results found for this or any previous visit (from the past 24 hour(s)). Imaging Studies: DG Knee Complete 4 Views Left  Result Date: 04/22/2022 CLINICAL DATA:  Post fall with pain and swelling. EXAM: LEFT KNEE - COMPLETE 4+ VIEW COMPARISON:  None Available. FINDINGS: Essentially nondisplaced lateral tibial plateau fracture. Fracture extends tibial spines. No metaphyseal component by radiograph. There is a moderate joint effusion. IMPRESSION: Essentially nondisplaced lateral tibial plateau fracture. Moderate joint effusion. Electronically Signed   By: 06/22/2022 M.D.   On: 04/22/2022 03:57    ED  COURSE and MDM  Nursing notes, initial and subsequent vitals signs, including pulse oximetry, reviewed and interpreted by myself.  Vitals:   04/22/22 0308 04/22/22 0309  BP: 109/74   Pulse: 96   Resp: 16   Temp: (!) 97.5 F (36.4 C)   TempSrc: Oral   SpO2: 98%   Weight:  70.3 kg  Height:  5\' 3"  (1.6 m)   Medications  naproxen (NAPROSYN) tablet 500 mg (has no administration in time range)   Will place patient in knee immobilizer and provide crutches.  We  will have her follow-up with orthopedics.  This may heal without surgical intervention.   PROCEDURES  Procedures   ED DIAGNOSES     ICD-10-CM   1. Tibial plateau fracture, left, closed, initial encounter  S82.142A     2. Fall, initial encounter  [id number], Jacqulyn Bath, MD 04/22/22 [phone]

## 2022-04-22 NOTE — ED Triage Notes (Signed)
Pt states she was outside tonight and stepped back into a ditch and fell  Pt is c/o left knee pain   Pt has swelling noted to her left knee
